# Patient Record
Sex: Male | Born: 1955 | Race: White | Hispanic: No | Smoking: Never smoker
Health system: Southern US, Community
[De-identification: ages and names within clinical notes are randomized; demographics above are authoritative.]

## PROBLEM LIST (undated history)

## (undated) DIAGNOSIS — I219 Acute myocardial infarction, unspecified: Secondary | ICD-10-CM

## (undated) DIAGNOSIS — F329 Major depressive disorder, single episode, unspecified: Secondary | ICD-10-CM

## (undated) DIAGNOSIS — I10 Essential (primary) hypertension: Secondary | ICD-10-CM

## (undated) DIAGNOSIS — I2510 Atherosclerotic heart disease of native coronary artery without angina pectoris: Secondary | ICD-10-CM

## (undated) DIAGNOSIS — E785 Hyperlipidemia, unspecified: Secondary | ICD-10-CM

## (undated) DIAGNOSIS — K8050 Calculus of bile duct without cholangitis or cholecystitis without obstruction: Secondary | ICD-10-CM

## (undated) DIAGNOSIS — E119 Type 2 diabetes mellitus without complications: Secondary | ICD-10-CM

## (undated) DIAGNOSIS — F32A Depression, unspecified: Secondary | ICD-10-CM

## (undated) DIAGNOSIS — J181 Lobar pneumonia, unspecified organism: Secondary | ICD-10-CM

## (undated) DIAGNOSIS — F419 Anxiety disorder, unspecified: Secondary | ICD-10-CM

## (undated) DIAGNOSIS — R911 Solitary pulmonary nodule: Secondary | ICD-10-CM

## (undated) HISTORY — DX: Anxiety disorder, unspecified: F41.9

## (undated) HISTORY — DX: Major depressive disorder, single episode, unspecified: F32.9

## (undated) HISTORY — DX: Hyperlipidemia, unspecified: E78.5

## (undated) HISTORY — DX: Type 2 diabetes mellitus without complications: E11.9

## (undated) HISTORY — DX: Atherosclerotic heart disease of native coronary artery without angina pectoris: I25.10

## (undated) HISTORY — DX: Calculus of bile duct without cholangitis or cholecystitis without obstruction: K80.50

## (undated) HISTORY — PX: CORONARY ARTERY BYPASS GRAFT: SHX141

## (undated) HISTORY — DX: Depression, unspecified: F32.A

## (undated) HISTORY — PX: EYE SURGERY: SHX253

## (undated) HISTORY — DX: Solitary pulmonary nodule: R91.1

## (undated) HISTORY — DX: Essential (primary) hypertension: I10

## (undated) HISTORY — DX: Lobar pneumonia, unspecified organism: J18.1

## (undated) HISTORY — DX: Acute myocardial infarction, unspecified: I21.9

---

## 2010-05-25 DIAGNOSIS — I219 Acute myocardial infarction, unspecified: Secondary | ICD-10-CM

## 2010-05-25 HISTORY — PX: CORONARY ARTERY BYPASS GRAFT: SHX141

## 2010-05-25 HISTORY — DX: Acute myocardial infarction, unspecified: I21.9

## 2015-06-19 ENCOUNTER — Ambulatory Visit (INDEPENDENT_AMBULATORY_CARE_PROVIDER_SITE_OTHER): Admitting: Family Medicine

## 2015-06-19 ENCOUNTER — Encounter: Payer: Self-pay | Admitting: Family Medicine

## 2015-06-19 VITALS — BP 157/91 | HR 88 | Ht 69.0 in | Wt 163.0 lb

## 2015-06-19 DIAGNOSIS — E1159 Type 2 diabetes mellitus with other circulatory complications: Secondary | ICD-10-CM | POA: Diagnosis not present

## 2015-06-19 DIAGNOSIS — I10 Essential (primary) hypertension: Secondary | ICD-10-CM

## 2015-06-19 DIAGNOSIS — M609 Myositis, unspecified: Secondary | ICD-10-CM

## 2015-06-19 DIAGNOSIS — Z951 Presence of aortocoronary bypass graft: Secondary | ICD-10-CM | POA: Diagnosis not present

## 2015-06-19 DIAGNOSIS — R112 Nausea with vomiting, unspecified: Secondary | ICD-10-CM

## 2015-06-19 DIAGNOSIS — IMO0001 Reserved for inherently not codable concepts without codable children: Secondary | ICD-10-CM

## 2015-06-19 DIAGNOSIS — E1165 Type 2 diabetes mellitus with hyperglycemia: Secondary | ICD-10-CM | POA: Insufficient documentation

## 2015-06-19 DIAGNOSIS — F329 Major depressive disorder, single episode, unspecified: Secondary | ICD-10-CM | POA: Insufficient documentation

## 2015-06-19 DIAGNOSIS — R1110 Vomiting, unspecified: Secondary | ICD-10-CM | POA: Insufficient documentation

## 2015-06-19 DIAGNOSIS — F419 Anxiety disorder, unspecified: Secondary | ICD-10-CM | POA: Insufficient documentation

## 2015-06-19 DIAGNOSIS — I252 Old myocardial infarction: Secondary | ICD-10-CM | POA: Diagnosis not present

## 2015-06-19 DIAGNOSIS — Z8639 Personal history of other endocrine, nutritional and metabolic disease: Secondary | ICD-10-CM

## 2015-06-19 DIAGNOSIS — I152 Hypertension secondary to endocrine disorders: Secondary | ICD-10-CM | POA: Insufficient documentation

## 2015-06-19 DIAGNOSIS — Z794 Long term (current) use of insulin: Secondary | ICD-10-CM

## 2015-06-19 DIAGNOSIS — M791 Myalgia: Secondary | ICD-10-CM

## 2015-06-19 DIAGNOSIS — F418 Other specified anxiety disorders: Secondary | ICD-10-CM

## 2015-06-19 LAB — CK: Total CK: 42 U/L (ref 7–232)

## 2015-06-19 LAB — CBC
HCT: 44.4 % (ref 39.0–52.0)
HEMOGLOBIN: 15.6 g/dL (ref 13.0–17.0)
MCH: 30.8 pg (ref 26.0–34.0)
MCHC: 35.1 g/dL (ref 30.0–36.0)
MCV: 87.6 fL (ref 78.0–100.0)
MPV: 9.5 fL (ref 8.6–12.4)
PLATELETS: 206 10*3/uL (ref 150–400)
RBC: 5.07 MIL/uL (ref 4.22–5.81)
RDW: 13 % (ref 11.5–15.5)
WBC: 6.5 10*3/uL (ref 4.0–10.5)

## 2015-06-19 LAB — COMPLETE METABOLIC PANEL WITH GFR
ALBUMIN: 4.3 g/dL (ref 3.6–5.1)
ALK PHOS: 64 U/L (ref 40–115)
ALT: 24 U/L (ref 9–46)
AST: 14 U/L (ref 10–35)
BUN: 12 mg/dL (ref 7–25)
CALCIUM: 9 mg/dL (ref 8.6–10.3)
CO2: 25 mmol/L (ref 20–31)
CREATININE: 0.91 mg/dL (ref 0.70–1.33)
Chloride: 102 mmol/L (ref 98–110)
GFR, Est African American: >89 mL/min (ref >=60)
GFR, Est Non African American: >89 mL/min (ref >=60)
Glucose, Bld: 137 mg/dL — ABNORMAL HIGH (ref 65–99)
POTASSIUM: 4.1 mmol/L (ref 3.5–5.3)
Sodium: 138 mmol/L (ref 135–146)
Total Bilirubin: 0.5 mg/dL (ref 0.2–1.2)
Total Protein: 6.9 g/dL (ref 6.1–8.1)

## 2015-06-19 LAB — HEMOGLOBIN A1C
HEMOGLOBIN A1C: 7 % — AB (ref <5.7)
MEAN PLASMA GLUCOSE: 154 mg/dL — AB (ref <117)

## 2015-06-19 LAB — LIPASE: Lipase: 19 U/L (ref 7–60)

## 2015-06-19 MED ORDER — SIMVASTATIN 40 MG PO TABS: 40.0000 mg | ORAL_TABLET | Freq: Every day | ORAL | Status: DC

## 2015-06-19 MED ORDER — ATENOLOL 25 MG PO TABS: 25.0000 mg | ORAL_TABLET | Freq: Every day | ORAL | Status: DC

## 2015-06-19 MED ORDER — METFORMIN HCL 1000 MG PO TABS: 1000.0000 mg | ORAL_TABLET | Freq: Two times a day (BID) | ORAL | Status: DC

## 2015-06-19 MED ORDER — NITROGLYCERIN 0.4 MG SL SUBL: 0.4000 mg | SUBLINGUAL_TABLET | SUBLINGUAL | Status: DC | PRN

## 2015-06-19 MED ORDER — FLUOXETINE HCL 20 MG PO TABS: 20.0000 mg | ORAL_TABLET | Freq: Every day | ORAL | Status: DC

## 2015-06-19 MED ORDER — CLOPIDOGREL BISULFATE 75 MG PO TABS: 75.0000 mg | ORAL_TABLET | Freq: Every day | ORAL | Status: DC

## 2015-06-19 NOTE — Progress Notes (Signed)
CC: Mark Maynard is a 60 y.o. male is here for Establish Care   Subjective: HPI:  Complicated but pleasant 60 year old here to establish care.  He has a history of type 2 diabetes and even had ketoacidosis last year. He's run out of his metformin 2 days ago. No outside blood sugars to report. Over the past 3 months he's been noticing forgetfulness, fatigue, a sweet odor to his feces, and vision loss ( blurry)   He has a history of at least 2 coronary bypass grafts and 7 stents. He's had multiple heart attacks. He takes aspirin and Plavix. He also takes a beta blocker and simvastatin. Since his last stent he denies any chest discomfort but does get anxious at climbing more than 2 flights of stairs. There've been no limb claudication.  He has a history of essential hypertension currently treated with atenolol. No outside blood pressure report.  He has a history of anxiety and depression that he feels is brought on by his complicated past medical history. His anxiety has been worsened over the past few weeks because he's being charged with not providing child support. He has a paper today from [facility] to clarify why he is disabled where he can't work. He doesn't his most recent job was at a [employer] however he was unable to work due to frequent vomiting and dehydration. He tells me he has to carry around a wastebasket because he could vomit at any time.  He does not daily basis for the past weeks he's had this diffuse body pain described as a weird sensation of his skin and muscles. Nothing seems to make it better it  gets worse if he tries to exercise.  Is coming by diffuse weakness without any focal weakness. He denies any overlying skin changes or any other motor or sensory disturbances.   Review Of Systems Outlined In HPI  Past Medical History  Diagnosis Date  . Diabetes mellitus without complication (HCC)   . Hypertension   . Hyperlipidemia   . Myocardial infarction (HCC)    . Depression   . Anxiety     History reviewed. No pertinent past surgical history. History reviewed. No pertinent family history.  Social History   Social History  . Marital Status: Unknown    Spouse Name: N/A  . Number of Children: N/A  . Years of Education: N/A   Occupational History  . Not on file.   Social History Main Topics  . Smoking status: Never Smoker   . Smokeless tobacco: Never Used  . Alcohol Use: 0.0 oz/week    0 Standard drinks or equivalent per week  . Drug Use: No  . Sexual Activity: Yes    Birth Control/ Protection: None   Other Topics Concern  . Not on file   Social History Narrative  . No narrative on file     Objective: BP 157/91 mmHg  Pulse 88  Ht  (1.753 m)  Wt 163 lb (73.936 kg)  BMI 24.06 kg/m2  General: Alert and Oriented, No Acute Distress HEENT: Pupils equal, round, reactive to light. Conjunctivae clear.  External ears unremarkable, canals clear with intact TMs with appropriate landmarks.  Middle ear appears open without effusion. Pink inferior turbinates.  Moist mucous membranes, pharynx without inflammation nor lesions.  Neck supple without palpable lymphadenopathy nor abnormal masses. Lungs: clear comfortable work of breathing Cardiac: Regular rate and rhythm. Extremities: No peripheral edema.  Strong peripheral pulses.  Mental Status: No depression, anxiety, nor  agitation. Skin: Warm and dry.  Assessment & Plan: Barnell was seen today for establish care.  Diagnoses and all orders for this visit:  Hx of CABG  Type 2 diabetes mellitus with other circulatory complication, with long-term current use of insulin (HCC) -     COMPLETE METABOLIC PANEL WITH GFR -     CBC -     Hemoglobin A1c -     Lipase  Essential hypertension -     COMPLETE METABOLIC PANEL WITH GFR -     CBC -     Hemoglobin A1c -     Lipase  History of heart attack  Anxiety and depression  History of ketoacidosis  Non-intractable vomiting with  nausea, vomiting of unspecified type -     COMPLETE METABOLIC PANEL WITH GFR -     CBC -     Hemoglobin A1c -     Lipase  Myalgia and myositis -     CK (Creatine Kinase)  Other orders -     nitroGLYCERIN (NITROSTAT) 0.4 MG SL tablet; Place 1 tablet (0.4 mg total) under the tongue every 5 (five) minutes as needed for chest pain. -     atenolol (TENORMIN) 25 MG tablet; Take 1 tablet (25 mg total) by mouth daily. -     clopidogrel (PLAVIX) 75 MG tablet; Take 1 tablet (75 mg total) by mouth daily. -     FLUoxetine (PROZAC) 20 MG tablet; Take 1 tablet (20 mg total) by mouth daily. -     simvastatin (ZOCOR) 40 MG tablet; Take 1 tablet (40 mg total) by mouth at bedtime. -     metFORMIN (GLUCOPHAGE) 1000 MG tablet; Take 1 tablet (1,000 mg total) by mouth 2 (two) times daily with a meal.   Type 2 diabetes: Number of his complaints that could be due to uncontrolled blood pressure therefore check an A1c and random blood sugar. Restart metformin, requesting outside records for exact dose of Lantus and NovoLog Essential hypertension: Controlled continue atenolol Coronary artery disease: Continue beta blocker statin aspirin and Plavix Anxiety and depression: Uncontrolled, restarting Prozac and encouraged him to seek out help from our psychiatric office downstairs. Vomiting: Checking a lipase, hepatic function panel and renal function. Myalgia: Checking CK.   Return if symptoms worsen or fail to improve.

## 2015-06-20 ENCOUNTER — Telehealth: Payer: Self-pay | Admitting: Family Medicine

## 2015-06-20 DIAGNOSIS — R112 Nausea with vomiting, unspecified: Secondary | ICD-10-CM

## 2015-06-20 NOTE — Telephone Encounter (Signed)
Pt advised.

## 2015-06-20 NOTE — Telephone Encounter (Signed)
Will you please let patient know that his A1c was 7.0 which is right at goal.  His liver function, kidney function, pancreatic function, and blood cell counts were all normal.  I'm at a loss to explain is extreme fatigue, I'd recommend he start an OTC Vitamin B Complex and a Vitamin D 1000 Units tablet daily to see if this helps.  Also I"m going to place a gastroenterology referral to help further workup his mysterious vomiting. I'll let him know once I receive records from his [place] doctor.

## 2016-03-19 ENCOUNTER — Other Ambulatory Visit: Payer: Self-pay | Admitting: *Deleted

## 2016-03-19 ENCOUNTER — Other Ambulatory Visit: Payer: Self-pay | Admitting: Osteopathic Medicine

## 2016-03-19 MED ORDER — METFORMIN HCL 1000 MG PO TABS: 1000.0000 mg | ORAL_TABLET | Freq: Two times a day (BID) | ORAL | 0 refills | Status: DC

## 2016-03-19 MED ORDER — CLOPIDOGREL BISULFATE 75 MG PO TABS: 75.0000 mg | ORAL_TABLET | Freq: Every day | ORAL | 0 refills | Status: DC

## 2016-03-19 MED ORDER — SIMVASTATIN 40 MG PO TABS: 40.0000 mg | ORAL_TABLET | Freq: Every day | ORAL | 0 refills | Status: DC

## 2016-03-19 MED ORDER — ATENOLOL 25 MG PO TABS: 25.0000 mg | ORAL_TABLET | Freq: Every day | ORAL | 0 refills | Status: DC

## 2016-03-19 MED ORDER — FLUOXETINE HCL 20 MG PO TABS: 20.0000 mg | ORAL_TABLET | Freq: Every day | ORAL | 0 refills | Status: DC

## 2016-03-19 NOTE — Progress Notes (Signed)
Patient needs 30 min appt to reestablish for DM and BP/health maint. and really another appt for depression.

## 2016-03-19 NOTE — Telephone Encounter (Signed)
Pt called clinic today requesting refills on his medications. States he was just informed of Dr. Genelle BalHommel's departure and is looking for a new PCP. Pt is requesting a refill on the pended medications for a 30 day supply. Questioned how long Pt has been out of Rx's since we have not written anything since January and he states he has been "milking them to last." Pt is going to find a new PCP at a different office. Will route to Provider in office today for review on 30 day supply.

## 2016-03-25 ENCOUNTER — Telehealth: Payer: Self-pay | Admitting: Family Medicine

## 2016-03-25 NOTE — Telephone Encounter (Signed)
I called patient and talk with him and let him know that he does need to make an appointment with a new pcp here since Dr. Ivan AnchorsHommel is no longer here and this way he can f/u on meds, DM and BP. He states he will call to schedule

## 2016-04-02 ENCOUNTER — Other Ambulatory Visit: Payer: Self-pay

## 2016-04-02 MED ORDER — ATENOLOL 25 MG PO TABS: 25.0000 mg | ORAL_TABLET | Freq: Every day | ORAL | 0 refills | Status: DC

## 2016-04-02 MED ORDER — METFORMIN HCL 1000 MG PO TABS: 1000.0000 mg | ORAL_TABLET | Freq: Two times a day (BID) | ORAL | 0 refills | Status: DC

## 2016-04-28 ENCOUNTER — Ambulatory Visit (INDEPENDENT_AMBULATORY_CARE_PROVIDER_SITE_OTHER): Admitting: Family Medicine

## 2016-04-28 ENCOUNTER — Encounter: Payer: Self-pay | Admitting: Family Medicine

## 2016-04-28 VITALS — BP 155/82 | HR 78 | Ht 69.0 in | Wt 161.0 lb

## 2016-04-28 DIAGNOSIS — Z23 Encounter for immunization: Secondary | ICD-10-CM

## 2016-04-28 DIAGNOSIS — F329 Major depressive disorder, single episode, unspecified: Secondary | ICD-10-CM

## 2016-04-28 DIAGNOSIS — Z1159 Encounter for screening for other viral diseases: Secondary | ICD-10-CM

## 2016-04-28 DIAGNOSIS — I2510 Atherosclerotic heart disease of native coronary artery without angina pectoris: Secondary | ICD-10-CM | POA: Insufficient documentation

## 2016-04-28 DIAGNOSIS — Z794 Long term (current) use of insulin: Secondary | ICD-10-CM | POA: Diagnosis not present

## 2016-04-28 DIAGNOSIS — I10 Essential (primary) hypertension: Secondary | ICD-10-CM

## 2016-04-28 DIAGNOSIS — F418 Other specified anxiety disorders: Secondary | ICD-10-CM

## 2016-04-28 DIAGNOSIS — Z951 Presence of aortocoronary bypass graft: Secondary | ICD-10-CM | POA: Diagnosis not present

## 2016-04-28 DIAGNOSIS — I25118 Atherosclerotic heart disease of native coronary artery with other forms of angina pectoris: Secondary | ICD-10-CM

## 2016-04-28 DIAGNOSIS — E1159 Type 2 diabetes mellitus with other circulatory complications: Secondary | ICD-10-CM | POA: Diagnosis not present

## 2016-04-28 DIAGNOSIS — Z114 Encounter for screening for human immunodeficiency virus [HIV]: Secondary | ICD-10-CM

## 2016-04-28 DIAGNOSIS — F419 Anxiety disorder, unspecified: Secondary | ICD-10-CM

## 2016-04-28 LAB — POCT GLYCOSYLATED HEMOGLOBIN (HGB A1C): Hemoglobin A1C: 6.6

## 2016-04-28 LAB — POCT UA - MICROALBUMIN
Albumin/Creatinine Ratio, Urine, POC: <30
Creatinine, POC: 50 mg/dL
MICROALBUMIN (UR) POC: 10 mg/L

## 2016-04-28 MED ORDER — SIMVASTATIN 40 MG PO TABS: 40.0000 mg | ORAL_TABLET | Freq: Every day | ORAL | 0 refills | Status: DC

## 2016-04-28 MED ORDER — INSULIN GLARGINE 100 UNIT/ML SOLOSTAR PEN
25.0000 [IU] | PEN_INJECTOR | Freq: Every day | SUBCUTANEOUS | 1 refills | Status: DC
Start: 2016-04-28 — End: 2016-10-23

## 2016-04-28 MED ORDER — INSULIN ASPART 100 UNIT/ML ~~LOC~~ SOLN: 3.0000 [IU] | Freq: Three times a day (TID) | SUBCUTANEOUS | 1 refills | Status: DC

## 2016-04-28 MED ORDER — ATENOLOL 25 MG PO TABS: 25.0000 mg | ORAL_TABLET | Freq: Every day | ORAL | 0 refills | Status: DC

## 2016-04-28 MED ORDER — METFORMIN HCL 1000 MG PO TABS: 1000.0000 mg | ORAL_TABLET | Freq: Two times a day (BID) | ORAL | 0 refills | Status: DC

## 2016-04-28 MED ORDER — CLOPIDOGREL BISULFATE 75 MG PO TABS: 75.0000 mg | ORAL_TABLET | Freq: Every day | ORAL | 0 refills | Status: DC

## 2016-04-28 MED ORDER — FLUOXETINE HCL 40 MG PO CAPS: 40.0000 mg | ORAL_CAPSULE | Freq: Every day | ORAL | 0 refills | Status: DC

## 2016-04-28 NOTE — Progress Notes (Signed)
Subjective:     Patient ID: Mark FusiJoseph Brinkmeyer, male   DOB: November 01, 1955, 60 y.o.   MRN: [id number]  HPI This is a pleasant 60 yo M with PMHx significant for DMII, multiple myocardial infarctions s/p CABG, HTN, and depression. He is new to this practice and would like to establish care and refill his medications.  Diabetes Type II: Has had DM since 2002. States he was uncontrolled for some time with A1Cs running over 9.0. Takes Novolog 3 units every meal and Lantus 25 units before bed. Does not check his blood sugars. No recent hypoglycemic episodes. Has not had an eye exam in years.  Lab Results  Component Value Date   HGBA1C 6.6 04/28/2016    CAD: [facility]. He carries Nitro on his person for angina. Takes Clopidogrel daily.   HTN: has been on Atenolol since 2002. Ran out of this medication this week. Denies headache or chest pain.  Depression: has been stable on Prozac since 2002. Says he has been told before that he has Bipolar I. One month ago he ran out of his medication and states he was able to go days without sleeping and impulsively drove to [place]. No history of hospitalizations. PHQ-9 score 1 today  Preventive Care: -Colonoscopy: has never had colon cancer screening. States he does not feel he has symptoms and he has no family history.  -Ophthalmology exam: overdue. Plans to establish care with an eye doctor. -Pneumonia vaccine: has never had. Patient would like to receive it today.   Review of Systems  Constitutional: Negative.   Respiratory: Negative for cough and shortness of breath.   Cardiovascular: Negative for chest pain, palpitations and leg swelling.  Gastrointestinal: Negative.   Endocrine: Negative.   Genitourinary: Negative.  Negative for difficulty urinating.  Musculoskeletal: Negative.   Skin: Negative.   Neurological: Negative.   Psychiatric/Behavioral: Negative.        Objective:   Physical Exam  Constitutional: He is active.  He does not appear ill. No distress.  HENT:  Mouth/Throat: Uvula is midline, oropharynx is clear and moist and mucous membranes are normal. No oral lesions. Dental caries present.  Eyes: Conjunctivae and EOM are normal. Pupils are equal, round, and reactive to light.  Cardiovascular: Normal rate, regular rhythm, normal heart sounds and normal pulses.   Pulmonary/Chest: Effort normal and breath sounds normal.  Abdominal: Soft. There is no tenderness.  Neurological: He is alert. No cranial nerve deficit. Gait normal.  Skin: Skin is warm. No rash noted.  Psychiatric: He has a normal mood and affect. His speech is normal and behavior is normal.       Vitals:   04/28/16 1054  BP: (!) 155/82  Pulse: 78      Assessment & Plan:     1. Essential hypertension - atenolol (TENORMIN) 25 MG tablet; Take 1 tablet (25 mg total) by mouth daily. Please schedule a f/u appt  Dispense: 90 tablet; Refill: 0 Noted that his blood pressure was elevated today. He has not taken his Atenolol in 2 days. Will monitor and reassess in 3 months.  2. Type 2 diabetes mellitus with other circulatory complication, with long-term current use of insulin (HCC) A1C is 6.6 on current medication regimen - metFORMIN (GLUCOPHAGE) 1000 MG tablet; Take 1 tablet (1,000 mg total) by mouth 2 (two) times daily with a meal.  Dispense: 90 tablet; Refill: 0 - Insulin Glargine (LANTUS SOLOSTAR) 100 UNIT/ML Solostar Pen; Inject 25 Units  into the skin at bedtime.  Dispense: 5 pen; Refill: 1 - Comprehensive metabolic panel - Lipid panel - CBC - POCT HgB A1C - POCT UA - Microalbumin  3. Hx of CABG - clopidogrel (PLAVIX) 75 MG tablet; Take 1 tablet (75 mg total) by mouth daily.  Dispense: 90 tablet; Refill: 0 - simvastatin (ZOCOR) 40 MG tablet; Take 1 tablet (40 mg total) by mouth at bedtime.  Dispense: 90 tablet; Refill: 0 - Lipid panel  4. Anxiety and depression - FLUoxetine (PROZAC) 40 MG capsule; Take 1 capsule (40 mg total) by  mouth daily.  Dispense: 90 capsule; Refill: 0  5. Coronary artery disease of native heart with stable angina pectoris, unspecified vessel or lesion type (HCC) - clopidogrel (PLAVIX) 75 MG tablet; Take 1 tablet (75 mg total) by mouth daily.  Dispense: 90 tablet; Refill: 0 - simvastatin (ZOCOR) 40 MG tablet; Take 1 tablet (40 mg total) by mouth at bedtime.  Dispense: 90 tablet; Refill: 0 - Lipid panel  6. Screening for HIV (human immunodeficiency virus) - HIV antibody  7. Encounter for hepatitis C screening test for low risk patient - Hepatitis C antibody  8. Need for vaccination - Pneumococcal polysaccharide vaccine 23-valent greater than or equal to 2yo subcutaneous/IM    Patient education provided Follow-up in 3 months Will re-address colonoscopy and other preventive care at that time  I spent 40 minutes with this patient, greater than 50% was face-to-face time counseling regarding the above diagnosis.   Medical screening examination/treatment/procedure(s) were performed by a resident physician or non-physician practitioner and as the supervising physician I was immediately available for consultation/collaboration.  Clementeen GrahamEvan Leondre Taul, MD

## 2016-04-29 LAB — LIPID PANEL
CHOL/HDL RATIO: 3.9 ratio (ref <5.0)
CHOLESTEROL: 181 mg/dL (ref <200)
HDL: 47 mg/dL (ref >40)
LDL Cholesterol: 96 mg/dL (ref <100)
Triglycerides: 189 mg/dL — ABNORMAL HIGH (ref <150)
VLDL: 38 mg/dL — ABNORMAL HIGH (ref <30)

## 2016-04-29 LAB — COMPREHENSIVE METABOLIC PANEL
ALBUMIN: 4.4 g/dL (ref 3.6–5.1)
ALK PHOS: 65 U/L (ref 40–115)
ALT: 22 U/L (ref 9–46)
AST: 12 U/L (ref 10–35)
BUN: 11 mg/dL (ref 7–25)
CO2: 24 mmol/L (ref 20–31)
CREATININE: 0.87 mg/dL (ref 0.70–1.25)
Calcium: 9.1 mg/dL (ref 8.6–10.3)
Chloride: 103 mmol/L (ref 98–110)
Glucose, Bld: 171 mg/dL — ABNORMAL HIGH (ref 65–99)
POTASSIUM: 4.3 mmol/L (ref 3.5–5.3)
Sodium: 138 mmol/L (ref 135–146)
TOTAL PROTEIN: 6.7 g/dL (ref 6.1–8.1)
Total Bilirubin: 0.6 mg/dL (ref 0.2–1.2)

## 2016-04-29 LAB — CBC
HEMATOCRIT: 42.2 % (ref 38.5–50.0)
Hemoglobin: 14.8 g/dL (ref 13.2–17.1)
MCH: 31.4 pg (ref 27.0–33.0)
MCHC: 35.1 g/dL (ref 32.0–36.0)
MCV: 89.6 fL (ref 80.0–100.0)
MPV: 9.8 fL (ref 7.5–12.5)
Platelets: 207 10*3/uL (ref 140–400)
RBC: 4.71 MIL/uL (ref 4.20–5.80)
RDW: 13 % (ref 11.0–15.0)
WBC: 9.7 10*3/uL (ref 3.8–10.8)

## 2016-04-30 LAB — HIV ANTIBODY (ROUTINE TESTING W REFLEX): HIV 1&2 Ab, 4th Generation: NONREACTIVE

## 2016-04-30 LAB — HEPATITIS C ANTIBODY: HCV Ab: NEGATIVE

## 2016-05-04 ENCOUNTER — Emergency Department (HOSPITAL_BASED_OUTPATIENT_CLINIC_OR_DEPARTMENT_OTHER)
Admission: EM | Admit: 2016-05-04 | Discharge: 2016-05-04 | Disposition: A | Discharge status: Home / Self Care | Attending: Emergency Medicine | Admitting: Emergency Medicine

## 2016-05-04 ENCOUNTER — Encounter (HOSPITAL_BASED_OUTPATIENT_CLINIC_OR_DEPARTMENT_OTHER): Payer: Self-pay | Admitting: *Deleted

## 2016-05-04 ENCOUNTER — Emergency Department (HOSPITAL_BASED_OUTPATIENT_CLINIC_OR_DEPARTMENT_OTHER)

## 2016-05-04 DIAGNOSIS — E119 Type 2 diabetes mellitus without complications: Secondary | ICD-10-CM | POA: Diagnosis not present

## 2016-05-04 DIAGNOSIS — K625 Hemorrhage of anus and rectum: Secondary | ICD-10-CM | POA: Diagnosis not present

## 2016-05-04 DIAGNOSIS — Z794 Long term (current) use of insulin: Secondary | ICD-10-CM | POA: Diagnosis not present

## 2016-05-04 DIAGNOSIS — I252 Old myocardial infarction: Secondary | ICD-10-CM | POA: Diagnosis not present

## 2016-05-04 DIAGNOSIS — I10 Essential (primary) hypertension: Secondary | ICD-10-CM | POA: Insufficient documentation

## 2016-05-04 DIAGNOSIS — Z79899 Other long term (current) drug therapy: Secondary | ICD-10-CM | POA: Diagnosis not present

## 2016-05-04 DIAGNOSIS — I2510 Atherosclerotic heart disease of native coronary artery without angina pectoris: Secondary | ICD-10-CM | POA: Diagnosis not present

## 2016-05-04 DIAGNOSIS — R1030 Lower abdominal pain, unspecified: Secondary | ICD-10-CM | POA: Diagnosis present

## 2016-05-04 LAB — CBC
HCT: 37.7 % — ABNORMAL LOW (ref 39.0–52.0)
Hemoglobin: 13.4 g/dL (ref 13.0–17.0)
MCH: 31.6 pg (ref 26.0–34.0)
MCHC: 35.5 g/dL (ref 30.0–36.0)
MCV: 88.9 fL (ref 78.0–100.0)
Platelets: 201 10*3/uL (ref 150–400)
RBC: 4.24 MIL/uL (ref 4.22–5.81)
RDW: 11.9 % (ref 11.5–15.5)
WBC: 6.9 10*3/uL (ref 4.0–10.5)

## 2016-05-04 LAB — COMPREHENSIVE METABOLIC PANEL
ALK PHOS: 59 U/L (ref 38–126)
ALT: 24 U/L (ref 17–63)
AST: 19 U/L (ref 15–41)
Albumin: 4.2 g/dL (ref 3.5–5.0)
Anion gap: 8 (ref 5–15)
BUN: 17 mg/dL (ref 6–20)
CALCIUM: 9.3 mg/dL (ref 8.9–10.3)
CHLORIDE: 103 mmol/L (ref 101–111)
CO2: 25 mmol/L (ref 22–32)
CREATININE: 0.85 mg/dL (ref 0.61–1.24)
GFR calc Af Amer: >60 mL/min (ref >60)
GFR calc non Af Amer: >60 mL/min (ref >60)
GLUCOSE: 149 mg/dL — AB (ref 65–99)
Potassium: 3.8 mmol/L (ref 3.5–5.1)
SODIUM: 136 mmol/L (ref 135–145)
Total Bilirubin: 0.4 mg/dL (ref 0.3–1.2)
Total Protein: 6.8 g/dL (ref 6.5–8.1)

## 2016-05-04 LAB — OCCULT BLOOD X 1 CARD TO LAB, STOOL: Fecal Occult Bld: POSITIVE — AB

## 2016-05-04 MED ORDER — POLYETHYLENE GLYCOL 3350 17 GM/SCOOP PO POWD: 17.0000 g | Freq: Two times a day (BID) | ORAL | 0 refills | Status: DC

## 2016-05-04 MED ORDER — IOPAMIDOL (ISOVUE-300) INJECTION 61%
100.0000 mL | Freq: Once | INTRAVENOUS | Status: AC | PRN
  Administered 2016-05-04: 100 mL via INTRAVENOUS

## 2016-05-04 MED ORDER — ONDANSETRON HCL 4 MG/2ML IJ SOLN
4.0000 mg | Freq: Once | INTRAMUSCULAR | Status: AC
  Administered 2016-05-04: 4 mg via INTRAVENOUS
  Filled 2016-05-04: qty 2

## 2016-05-04 MED ORDER — MORPHINE SULFATE (PF) 4 MG/ML IV SOLN
4.0000 mg | Freq: Once | INTRAVENOUS | Status: DC
  Filled 2016-05-04: qty 1

## 2016-05-04 NOTE — ED Notes (Signed)
Pt drove self to hospital and rates pain 2/10. Will hold morphine at this time. Pt instructed to let nurse know if pain worsened and he could have the morphine. Pt verbalized understanding.

## 2016-05-04 NOTE — Discharge Instructions (Signed)
Your labs and scan were largely unremarkable tonight.  You blood counts were normal.  There was no evidence of infection in your abdomen.  The scan did have an incidental finding of gallstones in your gallbladder, this does not require follow-up unless you get pain in your right upper abdomen.   Please schedule an appointment with the gastroenterologist.  You need to have a colonoscopy.  Return if you feel faint, short of breath, or have chest pain.

## 2016-05-04 NOTE — ED Provider Notes (Signed)
[facility] Provider Note   CSN: [id number] Arrival date & time: 05/04/16  2956 By signing my name below, I, Mark Maynard, attest that this documentation has been prepared under the direction and in the presence of [name]. Electronically Signed: Linus Maynard, ED Scribe. 05/04/16. 8:44 PM.  History   Chief Complaint Chief Complaint  Patient presents with  . Rectal Bleeding   The history is provided by the patient. No language interpreter was used.   HPI Comments: Mark Maynard is a 60 y.o. male who presents to the Emergency Department with a PMHx of DM, HLD, HTN, and MI complaining of rectal bleeding with associated pain that began today. Pt also reports constant lower abdominal pain, 2/10 in severity. Pt states that when he woke up this morning, he noted moisture around his rectum. When he checked, he noted "rust" colored blood. This happened again later in the day. Pt denies any dysuria, hematuria, fevers, chills, CP, SOB, N/V/D or any other symptoms at this time. Pt last BM was today. Pt denies any new medications or foods. Pt is on Plavix and ASA. Pt has  never had a colonoscopy.   Past Medical History:  Diagnosis Date  . Anxiety   . Depression   . Diabetes mellitus without complication (HCC)   . Hyperlipidemia   . Hypertension   . Myocardial infarction 2012   x 3, s/p CABG, 9 stents    Patient Active Problem List   Diagnosis Date Noted  . Coronary artery disease 04/28/2016  . Hx of CABG 06/19/2015  . Type 2 diabetes mellitus (HCC) 06/19/2015  . Essential hypertension 06/19/2015  . History of heart attack 06/19/2015  . Anxiety and depression 06/19/2015  . History of ketoacidosis 06/19/2015  . Emesis 06/19/2015    Past Surgical History:  Procedure Laterality Date  . CORONARY ARTERY BYPASS GRAFT  2012  . EYE SURGERY  1980's   ptyrgium removal       Home Medications    Prior to Admission medications   Medication Sig Start Date End Date  Taking? Authorizing Provider  atenolol (TENORMIN) 25 MG tablet Take 1 tablet (25 mg total) by mouth daily. Please schedule a f/u appt 04/28/16   Rodolph Bong, MD  clopidogrel (PLAVIX) 75 MG tablet Take 1 tablet (75 mg total) by mouth daily. 04/28/16   Rodolph Bong, MD  FLUoxetine (PROZAC) 40 MG capsule Take 1 capsule (40 mg total) by mouth daily. 04/28/16   Rodolph Bong, MD  insulin aspart (NOVOLOG) 100 UNIT/ML injection Inject 3 Units into the skin 3 (three) times daily with meals. 04/28/16   Rodolph Bong, MD  Insulin Glargine (LANTUS SOLOSTAR) 100 UNIT/ML Solostar Pen Inject 25 Units into the skin at bedtime. 04/28/16   Rodolph Bong, MD  insulin glargine (LANTUS) 100 UNIT/ML injection Inject into the skin.    Historical Provider, MD  metFORMIN (GLUCOPHAGE) 1000 MG tablet Take 1 tablet (1,000 mg total) by mouth 2 (two) times daily with a meal. 04/28/16   Rodolph Bong, MD  nitroGLYCERIN (NITROSTAT) 0.4 MG SL tablet Place 1 tablet (0.4 mg total) under the tongue every 5 (five) minutes as needed for chest pain. 06/19/15   Sean Hommel, DO  simvastatin (ZOCOR) 40 MG tablet Take 1 tablet (40 mg total) by mouth at bedtime. 04/28/16   Rodolph Bong, MD    Family History Family History  Problem Relation Age of Onset  . Melanoma Father     Social  History Social History  Substance Use Topics  . Smoking status: Never Smoker  . Smokeless tobacco: Never Used  . Alcohol use 0.6 oz/week    1 Cans of beer per week     Allergies   Patient has no known allergies.   Review of Systems Review of Systems  Constitutional: Negative for chills and fever.  Respiratory: Negative for shortness of breath.   Cardiovascular: Negative for chest pain.  Gastrointestinal: Positive for abdominal pain and rectal pain. Negative for diarrhea, nausea and vomiting.  Genitourinary: Negative for dysuria and hematuria.   Physical Exam Updated Vital Signs BP 148/78   Pulse 67   Temp 97.9 F (36.6 C)   Resp 18   Ht 5\' 8"   (1.727 m)   Wt 160 lb (72.6 kg)   SpO2 98%   BMI 24.33 kg/m   Physical Exam  Constitutional: He is oriented to person, place, and time. He appears well-developed and well-nourished. No distress.  HENT:  Head: Normocephalic and atraumatic.  Nose: Nose normal.  Eyes: Conjunctivae are normal.  Neck: Neck supple. No tracheal deviation present.  Cardiovascular: Normal rate and regular rhythm.   Pulmonary/Chest: Effort normal. No respiratory distress.  Abdominal: Soft. Bowel sounds are normal. He exhibits no distension and no mass. There is tenderness. There is no rebound and no guarding. No hernia.  Mild LLQ tenderness  Genitourinary:  Genitourinary Comments: Chaperone present. Rectal exam remarkable for scant gross blood. No evidence of hemorrhoid, abscess, or fissure.   Neurological: He is alert and oriented to person, place, and time.  Skin: Skin is warm and dry.  Psychiatric: He has a normal mood and affect.   ED Treatments / Results  DIAGNOSTIC STUDIES: Oxygen Saturation is 98% on room air, normal by my interpretation.    COORDINATION OF CARE: 8:44 PM Discussed treatment plan with pt at bedside and pt agreed to plan.  Labs (all labs ordered are listed, but only abnormal results are displayed) Labs Reviewed  CBC - Abnormal; Notable for the following:       Result Value   HCT 37.7 (*)    All other components within normal limits  COMPREHENSIVE METABOLIC PANEL  OCCULT BLOOD X 1 CARD TO LAB, STOOL    EKG  EKG Interpretation None       Radiology No results found.  Procedures Procedures (including critical care time)  Medications Ordered in ED Medications - No data to display   Initial Impression / Assessment and Plan / ED Course  I have reviewed the triage vital signs and the nursing notes.  Pertinent labs & imaging results that were available during my care of the patient were reviewed by me and considered in my medical decision making (see chart for  details).  Clinical Course     Patient presents with rectal bleeding 1 day. There are no visible or palpable external or internal hemorrhoids. H&H is stable. Patient has had some left lower quadrant pain, but has no acute CT findings. There is an incidental cholelithiasis, but he has no right upper quadrant pain. He has never had a colonoscopy. I have discussed the importance of getting a colonoscopy and follow-up. Return precautions discussed. He is well-appearing, nontoxic, and stable for discharge and outpatient follow-up.  Patient discussed with Dr. Ethelda ChickJacubowitz, who agrees with the plan.  Final Clinical Impressions(s) / ED Diagnoses   Final diagnoses:  Rectal bleeding    New Prescriptions Discharge Medication List as of 05/04/2016 11:48 PM    START  taking these medications   Details  polyethylene glycol powder (GLYCOLAX/MIRALAX) powder Take 17 g by mouth 2 (two) times daily. Until daily soft stools  OTC, Starting Mon 05/04/2016, Print      I personally performed the services described in this documentation, which was scribed in my presence. The recorded information has been reviewed and is accurate.      Roxy Horsemanobert Leven Hoel, PA-C 05/05/16 0001    Doug SouSam Jacubowitz, MD 05/05/16 [id number]

## 2016-05-04 NOTE — ED Notes (Signed)
ED Provider at bedside. 

## 2016-05-04 NOTE — ED Triage Notes (Addendum)
Pt c/o rectal bleeding, lower adb pain, and rectal pain  with BM today

## 2016-07-27 ENCOUNTER — Ambulatory Visit: Admitting: Family Medicine

## 2016-08-18 ENCOUNTER — Other Ambulatory Visit: Payer: Self-pay

## 2016-08-18 DIAGNOSIS — F329 Major depressive disorder, single episode, unspecified: Secondary | ICD-10-CM

## 2016-08-18 DIAGNOSIS — F419 Anxiety disorder, unspecified: Principal | ICD-10-CM

## 2016-08-18 DIAGNOSIS — Z794 Long term (current) use of insulin: Secondary | ICD-10-CM

## 2016-08-18 DIAGNOSIS — Z951 Presence of aortocoronary bypass graft: Secondary | ICD-10-CM

## 2016-08-18 DIAGNOSIS — I10 Essential (primary) hypertension: Secondary | ICD-10-CM

## 2016-08-18 DIAGNOSIS — E1159 Type 2 diabetes mellitus with other circulatory complications: Secondary | ICD-10-CM

## 2016-08-18 MED ORDER — ATENOLOL 25 MG PO TABS: 25.0000 mg | ORAL_TABLET | Freq: Every day | ORAL | 0 refills | Status: DC

## 2016-08-18 MED ORDER — FLUOXETINE HCL 40 MG PO CAPS: ORAL_CAPSULE | ORAL | 0 refills | Status: DC

## 2016-08-18 MED ORDER — SIMVASTATIN 40 MG PO TABS: 40.0000 mg | ORAL_TABLET | Freq: Every day | ORAL | 0 refills | Status: DC

## 2016-08-18 MED ORDER — METFORMIN HCL 1000 MG PO TABS: 1000.0000 mg | ORAL_TABLET | Freq: Two times a day (BID) | ORAL | 0 refills | Status: DC

## 2016-09-25 ENCOUNTER — Other Ambulatory Visit: Payer: Self-pay | Admitting: Osteopathic Medicine

## 2016-10-01 ENCOUNTER — Other Ambulatory Visit: Payer: Self-pay | Admitting: *Deleted

## 2016-10-01 DIAGNOSIS — Z951 Presence of aortocoronary bypass graft: Secondary | ICD-10-CM

## 2016-10-01 MED ORDER — CLOPIDOGREL BISULFATE 75 MG PO TABS: 75.0000 mg | ORAL_TABLET | Freq: Every day | ORAL | 0 refills | Status: DC

## 2016-10-23 ENCOUNTER — Other Ambulatory Visit: Payer: Self-pay

## 2016-10-23 ENCOUNTER — Ambulatory Visit (INDEPENDENT_AMBULATORY_CARE_PROVIDER_SITE_OTHER): Admitting: Physician Assistant

## 2016-10-23 VITALS — BP 131/80 | HR 66 | Wt 153.0 lb

## 2016-10-23 DIAGNOSIS — I2510 Atherosclerotic heart disease of native coronary artery without angina pectoris: Secondary | ICD-10-CM | POA: Diagnosis not present

## 2016-10-23 DIAGNOSIS — Z7689 Persons encountering health services in other specified circumstances: Secondary | ICD-10-CM | POA: Diagnosis not present

## 2016-10-23 DIAGNOSIS — E11628 Type 2 diabetes mellitus with other skin complications: Secondary | ICD-10-CM | POA: Diagnosis not present

## 2016-10-23 DIAGNOSIS — E785 Hyperlipidemia, unspecified: Secondary | ICD-10-CM

## 2016-10-23 DIAGNOSIS — L03032 Cellulitis of left toe: Secondary | ICD-10-CM | POA: Diagnosis not present

## 2016-10-23 DIAGNOSIS — B351 Tinea unguium: Secondary | ICD-10-CM

## 2016-10-23 DIAGNOSIS — Z79899 Other long term (current) drug therapy: Secondary | ICD-10-CM

## 2016-10-23 DIAGNOSIS — Z1211 Encounter for screening for malignant neoplasm of colon: Secondary | ICD-10-CM

## 2016-10-23 DIAGNOSIS — F419 Anxiety disorder, unspecified: Secondary | ICD-10-CM

## 2016-10-23 DIAGNOSIS — F329 Major depressive disorder, single episode, unspecified: Secondary | ICD-10-CM

## 2016-10-23 LAB — POCT GLYCOSYLATED HEMOGLOBIN (HGB A1C): Hemoglobin A1C: 9

## 2016-10-23 MED ORDER — AMBULATORY NON FORMULARY MEDICATION: 0 refills | Status: DC

## 2016-10-23 MED ORDER — FLUOXETINE HCL 40 MG PO CAPS: 40.0000 mg | ORAL_CAPSULE | Freq: Every day | ORAL | 1 refills | Status: DC

## 2016-10-23 MED ORDER — ATENOLOL 25 MG PO TABS: 25.0000 mg | ORAL_TABLET | Freq: Every day | ORAL | 1 refills | Status: DC

## 2016-10-23 MED ORDER — CLINDAMYCIN HCL 300 MG PO CAPS: 300.0000 mg | ORAL_CAPSULE | Freq: Three times a day (TID) | ORAL | 0 refills | Status: AC

## 2016-10-23 MED ORDER — ATORVASTATIN CALCIUM 40 MG PO TABS: 40.0000 mg | ORAL_TABLET | Freq: Every day | ORAL | 1 refills | Status: DC

## 2016-10-23 MED ORDER — CIPROFLOXACIN HCL 750 MG PO TABS: 750.0000 mg | ORAL_TABLET | Freq: Two times a day (BID) | ORAL | 0 refills | Status: AC

## 2016-10-23 MED ORDER — DAPAGLIFLOZIN PRO-METFORMIN ER 5-1000 MG PO TB24: 10.0000 mg | ORAL_TABLET | Freq: Every day | ORAL | 1 refills | Status: DC

## 2016-10-23 NOTE — Progress Notes (Signed)
HPI:                                                                Mark Maynard is a 61 y.o. male who [name] today to establish care   DMII: taking Metformin once daily. 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 Not compliant with medications. Last A1C 6.6 (04/28/16). Does not checks blood sugars at home. Denies polyuria, vision change, and paresthesias. Denies hypoglycemic events. Patient reports he removed an in-grown toe-nail on his left great toe a few days ago and it has been painful and red. Eye exam: overdue  HTN: taking Atentolol daily. Compliant with medications. Does not check BP's at home. Denies vision change, headache, chest pain with exertion, orthopnea, lightheadedness, syncope and edema.   CAD/HLD: patient has a history of CABG x 2 and 7 stents. 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 His most recent event was 07/12/15. He has not established with a cardiologist. He is currently out of his Plavix. Taking Simvastatin 40mg  daily. Denies myalgias. Last LDL 96 (04/28/16). He denies chest pain with exertion and claudication. He states he does not have anginal episodes and has not needed nitro.  Health Maintenance Health Maintenance  Topic Date Due  . OPHTHALMOLOGY EXAM  04/24/2017 (Originally 11/14/1965)  . Fecal DNA (Cologuard)  04/24/2017 (Originally 11/14/2005)  . TETANUS/TDAP  10/23/2017 (Originally 11/15/1974)  . INFLUENZA VACCINE  12/23/2016  . HEMOGLOBIN A1C  04/24/2017  . FOOT EXAM  10/23/2017  . PNEUMOCOCCAL POLYSACCHARIDE VACCINE (2) 04/28/2021  . Hepatitis C Screening  Completed  . HIV Screening  Completed  . URINE MICROALBUMIN  Excluded    Past Medical History:  Diagnosis Date  . Anxiety   . Depression   . Diabetes mellitus without complication (HCC)   . Hyperlipidemia   . Hypertension   . Myocardial infarction 2012   x 3, s/p CABG, 9 stents   Past Surgical History:  Procedure  Laterality Date  . CORONARY ARTERY BYPASS GRAFT  2012  . EYE SURGERY  1980's   ptyrgium removal   Social History  Substance Use Topics  . Smoking status: Never Smoker  . Smokeless tobacco: Never Used  . Alcohol use 0.6 oz/week    1 Cans of beer per week   family history includes Melanoma in his father.  ROS: negative except as noted in the HPI  Medications: Current Outpatient Prescriptions  Medication Sig Dispense Refill  . atenolol (TENORMIN) 25 MG tablet Take 1 tablet (25 mg total) by mouth daily. 90 tablet 1  . clopidogrel (PLAVIX) 75 MG tablet Take 1 tablet (75 mg total) by mouth daily. 30 tablet 0  . FLUoxetine (PROZAC) 40 MG capsule Take 1 capsule (40 mg total) by mouth daily. 90 capsule 1  . AMBULATORY NON FORMULARY MEDICATION Single glucometer with lancets, test strips.  Please bill with insurance. 1 each 0  . atorvastatin (LIPITOR) 40 MG tablet Take 1 tablet (40 mg total) by mouth daily. 90 tablet 1  . ciprofloxacin (CIPRO) 750 MG tablet Take 1 tablet (750 mg total) by mouth 2 (two) times daily. 20 tablet 0  . clindamycin (CLEOCIN) 300 MG capsule Take 1 capsule (300 mg total) by mouth 3 (three) times  daily. 30 capsule 0  . Dapagliflozin-Metformin HCl ER (XIGDUO XR) 09-998 MG TB24 Take 10-2,000 mg by mouth daily with breakfast. 180 tablet 1   No current facility-administered medications for this visit.    No Known Allergies     Objective:  BP 131/80   Pulse 66   Wt 153 lb (69.4 kg)   BMI 23.26 kg/m  Gen: cooperative, not ill-appearing, no distress HEENT: normal conjunctiva, wearing glasses, neck supple, no thyromegaly or tenderness Pulm: Normal work of breathing, normal phonation, clear to auscultation bilaterally CV: Normal rate, regular rhythm, s1 and s2 distinct, no murmurs, clicks or rubs, no carotid bruit Neuro: alert and oriented x 3, EOM's intact, PERRLA, normal tone, no tremor MSK: moving all extremities, normal gait and station, no peripheral  edema Skin: right great toe with swelling, erythema and bleeding at the lateral nail fold Psych: appearance casual, mood "great", affect mood-congruent, normal speech and thought content  Diabetic Foot Exam - Simple   Simple Foot Form Diabetic Foot exam was performed with the following findings:  Yes 10/23/2016  8:58 AM  Visual Inspection Sensation Testing Pulse Check Comments     Depression screen [facility] 2/9 10/23/2016 04/28/2016  Decreased Interest 0 0  Down, Depressed, Hopeless 0 0  PHQ - 2 Score 0 0  Altered sleeping - 0  Tired, decreased energy - 0  Change in appetite - 0  Feeling bad or failure about yourself  - 0  Trouble concentrating - 1  Moving slowly or fidgety/restless - 0  Suicidal thoughts - 0  PHQ-9 Score - 1    Results for orders placed or performed in visit on 10/23/16 (from the past 72 hour(s))  POCT HgB A1C     Status: Abnormal   Collection Time: 10/23/16  9:09 AM  Result Value Ref Range   Hemoglobin A1C 9.0    No results found.    Assessment and Plan: 61 y.o. male with   1. Encounter to establish care - reviewed PMH and PSH  - Colonoscopy due  2. Type 2 diabetes mellitus with other skin complication, without long-term current use of insulin (HCC) - POCT HgB A1C 9.0, secondary to non-compliance with Metformin and Insulin. Changing patient's medication to Xigduo XR for better adherence - Foot exam performed - Patient instructed to measure morning fasting blood sugars 2-3 days per week - continue ACEi - Pneumovax UTD - Dapagliflozin-Metformin HCl ER (XIGDUO XR) 09-998 MG TB24; Take 10-2,000 mg by mouth daily with breakfast.  Dispense: 180 tablet; Refill: 1 - AMBULATORY NON FORMULARY MEDICATION; Single glucometer with lancets, test strips  Dispense: 1 each; Refill: 0  3. Paronychia of great toe, left - mild-moderate diabetic foot infection - educated patient to scrub and debride dead tissue with clean soap and water, to avoid cutting skin and nails -  ciprofloxacin (CIPRO) 750 MG tablet; Take 1 tablet (750 mg total) by mouth 2 (two) times daily.  Dispense: 20 tablet; Refill: 0 - clindamycin (CLEOCIN) 300 MG capsule; Take 1 capsule (300 mg total) by mouth 3 (three) times daily.  Dispense: 30 capsule; Refill: 0 Follow-up in 10 days with Dr. Ilsa Iha for possible toe nail removal  4. Onychomycosis due to dermatophyte - patient desires to start PO Lamisil. Will treat infection first and plan to start Lamisil after antibiotic therapy is completed  5. Coronary artery disease involving native heart without angina pectoris, unspecified vessel or lesion type - LDL not at goal <70. discontinuing Simvastatin and switching to Atorvastatin 40mg .  Recheck 8 weeks - atenolol (TENORMIN) 25 MG tablet; Take 1 tablet (25 mg total) by mouth daily.  Dispense: 90 tablet; Refill: 1 - atorvastatin (LIPITOR) 40 MG tablet; Take 1 tablet (40 mg total) by mouth daily.  Dispense: 90 tablet; Refill: 1 - Ambulatory referral to Cardiology for annual follow-up of CAD and Plavix refills  6. Anxiety and depression - PHQ2 negative, depression in remission on Fluoxetine - FLUoxetine (PROZAC) 40 MG capsule; Take 1 capsule (40 mg total) by mouth daily.  Dispense: 90 capsule; Refill: 1  7. Screening for colon cancer - Cologuard ordered  Patient education and anticipatory guidance given Patient agrees with treatment plan Follow-up in 3 months for Type II DM or sooner as needed  I spent 40 minutes with this patient, greater than 50% was face-to-face time counseling regarding the above diagnoses  Levonne Hubert PA-C

## 2016-10-23 NOTE — Patient Instructions (Addendum)
-   I have placed a referral for you to see Cardiology here in the [facility]. You will receive a phone call to schedule an appointment.  For Diabetes Start Xigduo - you can take this with your morning meal or with you evening meal, but you need to take 2 pills once a day Measure fasting blood sugar every day: goal for now is to get this to 80-130 Plan to follow-up in the office sooner if you experience low sugars  Plan to follow-up in the office sooner if your fasting sugars are consistently high Bring all sugar readings with you to your office visits Plan to recheck A1C in 3 months  For Your Toe: Take both antibiotics as prescribed for 10 days Clindamycin three times daily Cipro twice a day Warm compresses Scrub and debride dead tissue DO NOT cut your skin with scissors or nail clippers or remove your nails on your own Follow-up with Dr. Karie Schwalbe in 10 days

## 2016-10-25 ENCOUNTER — Encounter: Payer: Self-pay | Admitting: Physician Assistant

## 2016-10-26 ENCOUNTER — Encounter: Payer: Self-pay | Admitting: Physician Assistant

## 2016-10-26 DIAGNOSIS — E785 Hyperlipidemia, unspecified: Secondary | ICD-10-CM | POA: Insufficient documentation

## 2016-10-28 ENCOUNTER — Telehealth: Payer: Self-pay | Admitting: Physician Assistant

## 2016-10-28 DIAGNOSIS — IMO0002 Reserved for concepts with insufficient information to code with codable children: Secondary | ICD-10-CM

## 2016-10-28 DIAGNOSIS — E1165 Type 2 diabetes mellitus with hyperglycemia: Secondary | ICD-10-CM

## 2016-10-28 DIAGNOSIS — E11628 Type 2 diabetes mellitus with other skin complications: Secondary | ICD-10-CM

## 2016-10-28 MED ORDER — EMPAGLIFLOZIN-METFORMIN HCL ER 5-1000 MG PO TB24: 10.0000 mg | ORAL_TABLET | Freq: Every day | ORAL | 0 refills | Status: DC

## 2016-10-28 MED ORDER — EMPAGLIFLOZIN-METFORMIN HCL ER 10-1000 MG PO TB24: 10.0000 mg | ORAL_TABLET | Freq: Every day | ORAL | 0 refills | Status: DC

## 2016-10-28 NOTE — Telephone Encounter (Signed)
Prior Auth for Xigduo denied Prescription sent for Synjardy XR  Patient should take 2 tablets daily with his morning meal Instruct patient that medication will increase urinary frequency

## 2016-10-28 NOTE — Telephone Encounter (Signed)
Recommendations left on vm -EH/RMA  

## 2016-11-04 ENCOUNTER — Telehealth: Payer: Self-pay | Admitting: *Deleted

## 2016-11-04 DIAGNOSIS — E11628 Type 2 diabetes mellitus with other skin complications: Secondary | ICD-10-CM

## 2016-11-04 MED ORDER — AMBULATORY NON FORMULARY MEDICATION: 0 refills | Status: DC

## 2016-11-04 NOTE — Telephone Encounter (Signed)
[facility] pharmacy called and asked that we provide instructions on how often patient should use glucometer for billing purposes. Initially I left a message on their voicmail to use as directed but they have called back requesting specific instruction on how often patient needs to check blood sugar or insurance will not pay for glucometer or strips. A new prescription will need to be written with specific instructions.

## 2016-11-04 NOTE — Telephone Encounter (Signed)
New Rx with instructions signed and ready for fax

## 2017-01-30 ENCOUNTER — Other Ambulatory Visit: Payer: Self-pay | Admitting: Family Medicine

## 2017-01-30 DIAGNOSIS — F419 Anxiety disorder, unspecified: Principal | ICD-10-CM

## 2017-01-30 DIAGNOSIS — F329 Major depressive disorder, single episode, unspecified: Secondary | ICD-10-CM

## 2017-03-23 ENCOUNTER — Encounter: Payer: Self-pay | Admitting: Physician Assistant

## 2017-03-23 ENCOUNTER — Ambulatory Visit (INDEPENDENT_AMBULATORY_CARE_PROVIDER_SITE_OTHER): Payer: Medicare Other | Admitting: Physician Assistant

## 2017-03-23 VITALS — BP 143/76 | HR 70 | Wt 161.0 lb

## 2017-03-23 DIAGNOSIS — Z9119 Patient's noncompliance with other medical treatment and regimen: Secondary | ICD-10-CM | POA: Diagnosis not present

## 2017-03-23 DIAGNOSIS — I152 Hypertension secondary to endocrine disorders: Secondary | ICD-10-CM

## 2017-03-23 DIAGNOSIS — F419 Anxiety disorder, unspecified: Secondary | ICD-10-CM | POA: Diagnosis not present

## 2017-03-23 DIAGNOSIS — Z79899 Other long term (current) drug therapy: Secondary | ICD-10-CM | POA: Diagnosis not present

## 2017-03-23 DIAGNOSIS — E1165 Type 2 diabetes mellitus with hyperglycemia: Secondary | ICD-10-CM | POA: Diagnosis not present

## 2017-03-23 DIAGNOSIS — Z91199 Patient's noncompliance with other medical treatment and regimen due to unspecified reason: Secondary | ICD-10-CM

## 2017-03-23 DIAGNOSIS — I10 Essential (primary) hypertension: Secondary | ICD-10-CM

## 2017-03-23 DIAGNOSIS — F329 Major depressive disorder, single episode, unspecified: Secondary | ICD-10-CM

## 2017-03-23 DIAGNOSIS — E1159 Type 2 diabetes mellitus with other circulatory complications: Secondary | ICD-10-CM | POA: Diagnosis not present

## 2017-03-23 DIAGNOSIS — E1169 Type 2 diabetes mellitus with other specified complication: Secondary | ICD-10-CM

## 2017-03-23 DIAGNOSIS — Z951 Presence of aortocoronary bypass graft: Secondary | ICD-10-CM

## 2017-03-23 DIAGNOSIS — E785 Hyperlipidemia, unspecified: Secondary | ICD-10-CM

## 2017-03-23 DIAGNOSIS — I2510 Atherosclerotic heart disease of native coronary artery without angina pectoris: Secondary | ICD-10-CM

## 2017-03-23 DIAGNOSIS — Z23 Encounter for immunization: Secondary | ICD-10-CM

## 2017-03-23 DIAGNOSIS — Z135 Encounter for screening for eye and ear disorders: Secondary | ICD-10-CM | POA: Diagnosis not present

## 2017-03-23 LAB — POCT GLYCOSYLATED HEMOGLOBIN (HGB A1C): Hemoglobin A1C: 10.7

## 2017-03-23 MED ORDER — ATORVASTATIN CALCIUM 40 MG PO TABS
40.0000 mg | ORAL_TABLET | Freq: Every day | ORAL | 3 refills | Status: DC
Start: 2017-03-23 — End: 2017-03-25

## 2017-03-23 MED ORDER — FLUOXETINE HCL 40 MG PO CAPS: 40.0000 mg | ORAL_CAPSULE | Freq: Every day | ORAL | 1 refills | Status: DC

## 2017-03-23 MED ORDER — CLOPIDOGREL BISULFATE 75 MG PO TABS: 75.0000 mg | ORAL_TABLET | Freq: Every day | ORAL | 3 refills | Status: DC

## 2017-03-23 MED ORDER — EMPAGLIFLOZIN-METFORMIN HCL ER 12.5-1000 MG PO TB24: 2.0000 | ORAL_TABLET | Freq: Every day | ORAL | 0 refills | Status: DC

## 2017-03-23 MED ORDER — ATENOLOL 25 MG PO TABS: 25.0000 mg | ORAL_TABLET | Freq: Every day | ORAL | 1 refills | Status: DC

## 2017-03-23 MED ORDER — LISINOPRIL 10 MG PO TABS: 10.0000 mg | ORAL_TABLET | Freq: Every day | ORAL | 5 refills | Status: DC

## 2017-03-23 NOTE — Patient Instructions (Addendum)
- Stop Aspirin - Re-start your Plavix - Follow-up with Cardiology  For your blood pressure: - Start Lisinopril every morning with your other medications. Continue your Atenolol - Limit salt to <2000 mg/day - Follow DASH eating plan - limit alcohol to 2 standard drinks per day - avoid tobacco products - weight loss: 7% of current body weight - Follow-up in 2 weeks for nurse BP check Then follow-up every 6 months  For your diabetes - take 2 tablets of Metformin with breakfast in the morning - limit carbs to 30g at meals and 15g at snacks - schedule your diabetic eye exam  Carbohydrate Counting for Diabetes Mellitus, Adult Carbohydrate counting is a method for keeping track of how many carbohydrates you eat. Eating carbohydrates naturally increases the amount of sugar (glucose) in the blood. Counting how many carbohydrates you eat helps keep your blood glucose within normal limits, which helps you manage your diabetes (diabetes mellitus). It is important to know how many carbohydrates you can safely have in each meal. This is different for every person. A diet and nutrition specialist (registered dietitian) can help you make a meal plan and calculate how many carbohydrates you should have at each meal and snack. Carbohydrates are found in the following foods:  Grains, such as breads and cereals.  Dried beans and soy products.  Starchy vegetables, such as potatoes, peas, and corn.  Fruit and fruit juices.  Milk and yogurt.  Sweets and snack foods, such as cake, cookies, candy, chips, and soft drinks.  How do I count carbohydrates? There are two ways to count carbohydrates in food. You can use either of the methods or a combination of both. Reading "Nutrition Facts" on packaged food The "Nutrition Facts" list is included on the labels of almost all packaged foods and beverages in the [place] It includes:  The serving size.  Information about nutrients in each serving, including the  grams (g) of carbohydrate per serving.  To use the "Nutrition Facts":  Decide how many servings you will have.  Multiply the number of servings by the number of carbohydrates per serving.  The resulting number is the total amount of carbohydrates that you will be having.  Learning standard serving sizes of other foods When you eat foods containing carbohydrates that are not packaged or do not include "Nutrition Facts" on the label, you need to measure the servings in order to count the amount of carbohydrates:  Measure the foods that you will eat with a food scale or measuring cup, if needed.  Decide how many standard-size servings you will eat.  Multiply the number of servings by 15. Most carbohydrate-rich foods have about 15 g of carbohydrates per serving. ? For example, if you eat 8 oz (170 g) of strawberries, you will have eaten 2 servings and 30 g of carbohydrates (2 servings x 15 g = 30 g).  For foods that have more than one food mixed, such as soups and casseroles, you must count the carbohydrates in each food that is included.  The following list contains standard serving sizes of common carbohydrate-rich foods. Each of these servings has about 15 g of carbohydrates:   hamburger bun or  English muffin.   oz (15 mL) syrup.   oz (14 g) jelly.  1 slice of bread.  1 six-inch tortilla.  3 oz (85 g) cooked Mazzaferro or pasta.  4 oz (113 g) cooked dried beans.  4 oz (113 g) starchy vegetable, such as peas, corn, or  potatoes.  4 oz (113 g) hot cereal.  4 oz (113 g) mashed potatoes or  of a large baked potato.  4 oz (113 g) canned or frozen fruit.  4 oz (120 mL) fruit juice.  4-6 crackers.  6 chicken nuggets.  6 oz (170 g) unsweetened dry cereal.  6 oz (170 g) plain fat-free yogurt or yogurt sweetened with artificial sweeteners.  8 oz (240 mL) milk.  8 oz (170 g) fresh fruit or one small piece of fruit.  24 oz (680 g) popped popcorn.  Example of  carbohydrate counting Sample meal  3 oz (85 g) chicken breast.  6 oz (170 g) brown [place].  4 oz (113 g) corn.  8 oz (240 mL) milk.  8 oz (170 g) strawberries with sugar-free whipped topping. Carbohydrate calculation 1. Identify the foods that contain carbohydrates: ? Agrawal. ? Corn. ? Milk. ? Strawberries. 2. Calculate how many servings you have of each food: ? 2 servings [place]. ? 1 serving corn. ? 1 serving milk. ? 1 serving strawberries. 3. Multiply each number of servings by 15 g: ? 2 servings [place] x 15 g = 30 g. ? 1 serving corn x 15 g = 15 g. ? 1 serving milk x 15 g = 15 g. ? 1 serving strawberries x 15 g = 15 g. 4. Add together all of the amounts to find the total grams of carbohydrates eaten: ? 30 g + 15 g + 15 g + 15 g = 75 g of carbohydrates total. This information is not intended to replace advice given to you by your health care provider. Make sure you discuss any questions you have with your health care provider. Document Released: 05/11/2005 Document Revised: 11/29/2015 Document Reviewed: 10/23/2015 Elsevier Interactive Patient Education  [employer].

## 2017-03-23 NOTE — Progress Notes (Signed)
HPI:                                                                Mark Maynard is a 61 y.o. male who presents to [name]: [facility] Medicine today for medication management  DMII: ran out of his Synjardy 2 months ago. Missed his follow-up appointment. Last A1C 9.0, 5 months ago.  Denies polyuria, vision change, and paresthesias. Denies hypoglycemic events. Denies ulcers/wounds on feet. Completed antibiotic therapy and reports paronychia resolved.  Eye exam: overdue Foot exam: UTD  CAD: did not follow-up with Cardiology. Has been out of Plavix for 5 months. He started himself on Asprin 325 mg. Denies exertional chest pain. Compliant with his statin.  Depression/Anxiety: taking Fluoxetine without difficulty. Denies depressed mood or anhedonia. Denies symptoms of mania/hypomania. Denies suicidal thinking. Denies auditory/visual hallucinations.  Past Medical History:  Diagnosis Date  . Anxiety   . CAD (coronary artery disease)   . Depression   . Diabetes mellitus without complication (HCC)   . Hyperlipidemia   . Hypertension   . Myocardial infarction (HCC) 2012   x 3, s/p CABG, 9 stents   Past Surgical History:  Procedure Laterality Date  . CORONARY ARTERY BYPASS GRAFT  2012  . CORONARY ARTERY BYPASS GRAFT    . EYE SURGERY  1980's   ptyrgium removal   Social History  Substance Use Topics  . Smoking status: Never Smoker  . Smokeless tobacco: Never Used  . Alcohol use 0.6 oz/week    1 Cans of beer per week   family history includes Melanoma in his father.  ROS: negative except as noted in the HPI  Medications: Current Outpatient Prescriptions  Medication Sig Dispense Refill  . AMBULATORY NON FORMULARY MEDICATION Single glucometer with lancets, test strips.   Check fasting morning blood sugar QD. Please bill with insurance. 1 each 0  . atenolol (TENORMIN) 25 MG tablet Take 1 tablet (25 mg total) by mouth daily. 90 tablet 1  . atorvastatin  (LIPITOR) 40 MG tablet Take 1 tablet (40 mg total) by mouth daily. 90 tablet 3  . clopidogrel (PLAVIX) 75 MG tablet Take 1 tablet (75 mg total) by mouth daily. 90 tablet 3  . Empagliflozin-Metformin HCl ER (SYNJARDY XR) 12.09-998 MG TB24 Take 2 tablets by mouth daily with breakfast. 180 tablet 0  . FLUoxetine (PROZAC) 40 MG capsule Take 1 capsule (40 mg total) by mouth daily. 90 capsule 1  . lisinopril (PRINIVIL,ZESTRIL) 10 MG tablet Take 1 tablet (10 mg total) by mouth daily. 30 tablet 5   No current facility-administered medications for this visit.    No Known Allergies     Objective:  BP (!) 143/76   Pulse 70   Wt 161 lb (73 kg)   BMI 24.48 kg/m  Gen:  alert, not ill-appearing, no distress, appropriate for age HEENT: head normocephalic without obvious abnormality, conjunctiva and cornea clear, wearing glasses, trachea midline Pulm: Normal work of breathing, normal phonation, clear to auscultation bilaterally, no wheezes, rales or rhonchi CV: Normal rate, regular rhythm, s1 and s2 distinct, no murmurs, clicks or rubs, no carotid bruit  Neuro: alert and oriented x 3, no tremor MSK: extremities atraumatic, normal gait and station Skin: intact, no rashes on exposed skin,  no jaundice, no cyanosis Psych: well-groomed, cooperative, good eye contact, euthymic mood, affect mood-congruent, speech is articulate, and thought processes clear and goal-directed  [facility] 10/23/2016 04/28/2016  Decreased Interest 0 0  Down, Depressed, Hopeless 0 0  PHQ - 2 Score 0 0  Altered sleeping - 0  Tired, decreased energy - 0  Change in appetite - 0  Feeling bad or failure about yourself  - 0  Trouble concentrating - 1  Moving slowly or fidgety/restless - 0  Suicidal thoughts - 0  PHQ-9 Score - 1     Results for orders placed or performed in visit on 03/23/17 (from the past 72 hour(s))  POCT HgB A1C     Status: Abnormal   Collection Time: 03/23/17  2:35 PM  Result Value Ref Range    Hemoglobin A1C 10.7    No results found.     Assessment and Plan: 61 y.o. male with   1. Encounter for long-term current use of medication   2. Uncontrolled type 2 diabetes mellitus with hyperglycemia, without long-term current use of insulin (HCC) - POCT HgB A1C 10.4 today, worsened compared to 5 months ago. This is likely due to medication noncompliance / lack of follow-up. Patient understands he is to follow-up every 3 months for his uncontrolled diabetes - increasing Synjardy - influenza vaccine given today. Pneumovax UTD - referral placed for diabetic eye exam - on statin - starting ACE - Empagliflozin-Metformin HCl ER (SYNJARDY XR) 12.09-998 MG TB24; Take 2 tablets by mouth daily with breakfast.  Dispense: 180 tablet; Refill: 0 - Ambulatory referral to Ophthalmology   3. Hypertension associated with diabetes (HCC) BP Readings from Last 3 Encounters:  03/23/17 (!) 143/76  10/23/16 131/80  05/04/16 178/96  - starting ACE. Continue beta blocker - therapeutic lifestyle changes - lisinopril (PRINIVIL,ZESTRIL) 10 MG tablet; Take 1 tablet (10 mg total) by mouth daily.  Dispense: 30 tablet; Refill: 5  4. Hyperlipidemia associated with type 2 diabetes mellitus (HCC) - never went for his fasting lipid panel in June - last LDL 91 07/11/15 - goal LDL <30<70  5. Hx of CABG - discussed that given his complex history of multiple CABG and stents, he needs to be followed by a Cardiologist at least yearly. Agree to fill Plavix today. Instructed to dc Aspirin - clopidogrel (PLAVIX) 75 MG tablet; Take 1 tablet (75 mg total) by mouth daily.  Dispense: 90 tablet; Refill: 3  6. Coronary artery disease involving native heart without angina pectoris, unspecified vessel or lesion type - atorvastatin (LIPITOR) 40 MG tablet; Take 1 tablet (40 mg total) by mouth daily.  Dispense: 90 tablet; Refill: 3 - atenolol (TENORMIN) 25 MG tablet; Take 1 tablet (25 mg total) by mouth daily.  Dispense: 90  tablet; Refill: 1 - Ambulatory referral to Cardiology  7. Anxiety and depression - FLUoxetine (PROZAC) 40 MG capsule; Take 1 capsule (40 mg total) by mouth daily.  Dispense: 90 capsule; Refill: 1  8. Screening for diabetic retinopathy - Ambulatory referral to Ophthalmology  9. Need for immunization against influenza - Flu Vaccine QUAD 36+ mos IM  10. Patient noncompliance, general - denies financial, transportation, or physical disability barriers - despite known CAD and multiple AMI, patient has refused to follow-up with Cardiology. Referral placed again today. Patient understands risks - 2 months late for his diabetes follow-up. Patient understands he needs to return in 3 months  Patient education and anticipatory guidance given Patient agrees with treatment plan Follow-upin 3 months  for diabetes or sooner as needed if symptoms worsen or fail to improve  Levonne Hubert PA-C

## 2017-03-24 ENCOUNTER — Encounter: Payer: Self-pay | Admitting: Physician Assistant

## 2017-03-24 DIAGNOSIS — Z91199 Patient's noncompliance with other medical treatment and regimen due to unspecified reason: Secondary | ICD-10-CM | POA: Insufficient documentation

## 2017-03-24 DIAGNOSIS — E785 Hyperlipidemia, unspecified: Secondary | ICD-10-CM

## 2017-03-24 DIAGNOSIS — Z9119 Patient's noncompliance with other medical treatment and regimen: Secondary | ICD-10-CM | POA: Insufficient documentation

## 2017-03-24 DIAGNOSIS — E1169 Type 2 diabetes mellitus with other specified complication: Secondary | ICD-10-CM | POA: Insufficient documentation

## 2017-03-25 ENCOUNTER — Other Ambulatory Visit: Payer: Self-pay

## 2017-03-25 DIAGNOSIS — F419 Anxiety disorder, unspecified: Secondary | ICD-10-CM

## 2017-03-25 DIAGNOSIS — I10 Essential (primary) hypertension: Secondary | ICD-10-CM

## 2017-03-25 DIAGNOSIS — E1159 Type 2 diabetes mellitus with other circulatory complications: Secondary | ICD-10-CM

## 2017-03-25 DIAGNOSIS — Z951 Presence of aortocoronary bypass graft: Secondary | ICD-10-CM

## 2017-03-25 DIAGNOSIS — E1165 Type 2 diabetes mellitus with hyperglycemia: Secondary | ICD-10-CM

## 2017-03-25 DIAGNOSIS — I2510 Atherosclerotic heart disease of native coronary artery without angina pectoris: Secondary | ICD-10-CM

## 2017-03-25 DIAGNOSIS — F329 Major depressive disorder, single episode, unspecified: Secondary | ICD-10-CM

## 2017-03-25 MED ORDER — ATORVASTATIN CALCIUM 40 MG PO TABS: 40.0000 mg | ORAL_TABLET | Freq: Every day | ORAL | 3 refills | Status: DC

## 2017-03-25 MED ORDER — EMPAGLIFLOZIN-METFORMIN HCL ER 12.5-1000 MG PO TB24: 2.0000 | ORAL_TABLET | Freq: Every day | ORAL | 0 refills | Status: AC

## 2017-03-25 MED ORDER — LISINOPRIL 10 MG PO TABS: 10.0000 mg | ORAL_TABLET | Freq: Every day | ORAL | 5 refills | Status: DC

## 2017-03-25 MED ORDER — FLUOXETINE HCL 40 MG PO CAPS: 40.0000 mg | ORAL_CAPSULE | Freq: Every day | ORAL | 1 refills | Status: DC

## 2017-03-25 MED ORDER — CLOPIDOGREL BISULFATE 75 MG PO TABS: 75.0000 mg | ORAL_TABLET | Freq: Every day | ORAL | 3 refills | Status: DC

## 2017-03-25 MED ORDER — ATENOLOL 25 MG PO TABS: 25.0000 mg | ORAL_TABLET | Freq: Every day | ORAL | 1 refills | Status: DC

## 2017-04-06 ENCOUNTER — Ambulatory Visit: Payer: TRICARE For Life (TFL)

## 2017-06-25 HISTORY — PX: PERCUTANEOUS CORONARY STENT INTERVENTION (PCI-S): SHX6016

## 2017-07-10 DIAGNOSIS — Z833 Family history of diabetes mellitus: Secondary | ICD-10-CM | POA: Diagnosis not present

## 2017-07-10 DIAGNOSIS — Z955 Presence of coronary angioplasty implant and graft: Secondary | ICD-10-CM | POA: Diagnosis not present

## 2017-07-10 DIAGNOSIS — Z794 Long term (current) use of insulin: Secondary | ICD-10-CM | POA: Diagnosis not present

## 2017-07-10 DIAGNOSIS — N179 Acute kidney failure, unspecified: Secondary | ICD-10-CM | POA: Diagnosis present

## 2017-07-10 DIAGNOSIS — E119 Type 2 diabetes mellitus without complications: Secondary | ICD-10-CM | POA: Diagnosis not present

## 2017-07-10 DIAGNOSIS — E1110 Type 2 diabetes mellitus with ketoacidosis without coma: Secondary | ICD-10-CM | POA: Diagnosis not present

## 2017-07-10 DIAGNOSIS — Z808 Family history of malignant neoplasm of other organs or systems: Secondary | ICD-10-CM | POA: Diagnosis not present

## 2017-07-10 DIAGNOSIS — I25118 Atherosclerotic heart disease of native coronary artery with other forms of angina pectoris: Secondary | ICD-10-CM | POA: Diagnosis present

## 2017-07-10 DIAGNOSIS — E782 Mixed hyperlipidemia: Secondary | ICD-10-CM | POA: Diagnosis not present

## 2017-07-10 DIAGNOSIS — R1010 Upper abdominal pain, unspecified: Secondary | ICD-10-CM | POA: Diagnosis present

## 2017-07-10 DIAGNOSIS — I517 Cardiomegaly: Secondary | ICD-10-CM | POA: Diagnosis not present

## 2017-07-10 DIAGNOSIS — I10 Essential (primary) hypertension: Secondary | ICD-10-CM | POA: Diagnosis present

## 2017-07-10 DIAGNOSIS — D72829 Elevated white blood cell count, unspecified: Secondary | ICD-10-CM | POA: Diagnosis not present

## 2017-07-10 DIAGNOSIS — I25810 Atherosclerosis of coronary artery bypass graft(s) without angina pectoris: Secondary | ICD-10-CM | POA: Diagnosis not present

## 2017-07-10 DIAGNOSIS — I083 Combined rheumatic disorders of mitral, aortic and tricuspid valves: Secondary | ICD-10-CM | POA: Diagnosis not present

## 2017-07-10 DIAGNOSIS — Z79899 Other long term (current) drug therapy: Secondary | ICD-10-CM | POA: Diagnosis not present

## 2017-07-10 DIAGNOSIS — I25798 Atherosclerosis of other coronary artery bypass graft(s) with other forms of angina pectoris: Secondary | ICD-10-CM | POA: Diagnosis not present

## 2017-07-10 DIAGNOSIS — E785 Hyperlipidemia, unspecified: Secondary | ICD-10-CM | POA: Diagnosis not present

## 2017-07-10 DIAGNOSIS — E1165 Type 2 diabetes mellitus with hyperglycemia: Secondary | ICD-10-CM | POA: Diagnosis not present

## 2017-07-10 DIAGNOSIS — R079 Chest pain, unspecified: Secondary | ICD-10-CM | POA: Diagnosis not present

## 2017-07-10 DIAGNOSIS — Z951 Presence of aortocoronary bypass graft: Secondary | ICD-10-CM | POA: Diagnosis not present

## 2017-07-10 DIAGNOSIS — I214 Non-ST elevation (NSTEMI) myocardial infarction: Secondary | ICD-10-CM | POA: Diagnosis present

## 2017-07-10 DIAGNOSIS — Z7902 Long term (current) use of antithrombotics/antiplatelets: Secondary | ICD-10-CM | POA: Diagnosis not present

## 2017-07-10 DIAGNOSIS — I259 Chronic ischemic heart disease, unspecified: Secondary | ICD-10-CM | POA: Diagnosis not present

## 2017-07-10 DIAGNOSIS — I2510 Atherosclerotic heart disease of native coronary artery without angina pectoris: Secondary | ICD-10-CM | POA: Diagnosis not present

## 2017-07-10 DIAGNOSIS — I25708 Atherosclerosis of coronary artery bypass graft(s), unspecified, with other forms of angina pectoris: Secondary | ICD-10-CM | POA: Diagnosis not present

## 2017-07-12 DIAGNOSIS — I214 Non-ST elevation (NSTEMI) myocardial infarction: Secondary | ICD-10-CM | POA: Diagnosis not present

## 2017-07-12 DIAGNOSIS — I2510 Atherosclerotic heart disease of native coronary artery without angina pectoris: Secondary | ICD-10-CM | POA: Diagnosis not present

## 2017-07-12 DIAGNOSIS — I10 Essential (primary) hypertension: Secondary | ICD-10-CM | POA: Diagnosis not present

## 2017-07-12 DIAGNOSIS — I25810 Atherosclerosis of coronary artery bypass graft(s) without angina pectoris: Secondary | ICD-10-CM | POA: Diagnosis not present

## 2017-07-12 DIAGNOSIS — E119 Type 2 diabetes mellitus without complications: Secondary | ICD-10-CM | POA: Diagnosis not present

## 2017-07-12 DIAGNOSIS — E785 Hyperlipidemia, unspecified: Secondary | ICD-10-CM | POA: Diagnosis not present

## 2017-07-12 DIAGNOSIS — Z951 Presence of aortocoronary bypass graft: Secondary | ICD-10-CM | POA: Diagnosis not present

## 2017-07-13 DIAGNOSIS — I2510 Atherosclerotic heart disease of native coronary artery without angina pectoris: Secondary | ICD-10-CM | POA: Diagnosis not present

## 2017-07-13 DIAGNOSIS — Z951 Presence of aortocoronary bypass graft: Secondary | ICD-10-CM | POA: Diagnosis not present

## 2017-07-13 DIAGNOSIS — I10 Essential (primary) hypertension: Secondary | ICD-10-CM | POA: Diagnosis not present

## 2017-07-13 DIAGNOSIS — E119 Type 2 diabetes mellitus without complications: Secondary | ICD-10-CM | POA: Diagnosis not present

## 2017-07-13 DIAGNOSIS — E785 Hyperlipidemia, unspecified: Secondary | ICD-10-CM | POA: Diagnosis not present

## 2017-07-13 DIAGNOSIS — I214 Non-ST elevation (NSTEMI) myocardial infarction: Secondary | ICD-10-CM | POA: Diagnosis not present

## 2017-07-16 ENCOUNTER — Ambulatory Visit (INDEPENDENT_AMBULATORY_CARE_PROVIDER_SITE_OTHER): Payer: Medicare Other | Admitting: Physician Assistant

## 2017-07-16 ENCOUNTER — Encounter: Payer: Self-pay | Admitting: Physician Assistant

## 2017-07-16 VITALS — BP 103/58 | HR 66 | Temp 97.9°F | Wt 144.0 lb

## 2017-07-16 DIAGNOSIS — K219 Gastro-esophageal reflux disease without esophagitis: Secondary | ICD-10-CM | POA: Diagnosis not present

## 2017-07-16 DIAGNOSIS — I252 Old myocardial infarction: Secondary | ICD-10-CM | POA: Diagnosis not present

## 2017-07-16 DIAGNOSIS — E1151 Type 2 diabetes mellitus with diabetic peripheral angiopathy without gangrene: Secondary | ICD-10-CM

## 2017-07-16 DIAGNOSIS — IMO0002 Reserved for concepts with insufficient information to code with codable children: Secondary | ICD-10-CM

## 2017-07-16 DIAGNOSIS — Z09 Encounter for follow-up examination after completed treatment for conditions other than malignant neoplasm: Secondary | ICD-10-CM

## 2017-07-16 DIAGNOSIS — F419 Anxiety disorder, unspecified: Secondary | ICD-10-CM | POA: Diagnosis not present

## 2017-07-16 DIAGNOSIS — F329 Major depressive disorder, single episode, unspecified: Secondary | ICD-10-CM

## 2017-07-16 DIAGNOSIS — E1165 Type 2 diabetes mellitus with hyperglycemia: Secondary | ICD-10-CM

## 2017-07-16 DIAGNOSIS — I2510 Atherosclerotic heart disease of native coronary artery without angina pectoris: Secondary | ICD-10-CM | POA: Diagnosis not present

## 2017-07-16 LAB — GLUCOSE, POCT (MANUAL RESULT ENTRY): POC GLUCOSE: 131 mg/dL — AB (ref 70–99)

## 2017-07-16 LAB — POCT GLYCOSYLATED HEMOGLOBIN (HGB A1C): Hemoglobin A1C: 6.8

## 2017-07-16 MED ORDER — ATORVASTATIN CALCIUM 80 MG PO TABS: 80.0000 mg | ORAL_TABLET | Freq: Every day | ORAL | 3 refills | Status: DC

## 2017-07-16 MED ORDER — PANTOPRAZOLE SODIUM 20 MG PO TBEC: 20.0000 mg | DELAYED_RELEASE_TABLET | Freq: Every day | ORAL | 0 refills | Status: DC

## 2017-07-16 MED ORDER — ISOSORBIDE MONONITRATE ER 30 MG PO TB24: 30.0000 mg | ORAL_TABLET | Freq: Every day | ORAL | 3 refills | Status: AC

## 2017-07-16 MED ORDER — AMBULATORY NON FORMULARY MEDICATION: 0 refills | Status: AC

## 2017-07-16 MED ORDER — ATENOLOL 25 MG PO TABS: 25.0000 mg | ORAL_TABLET | Freq: Every day | ORAL | 0 refills | Status: DC

## 2017-07-16 MED ORDER — FLUOXETINE HCL 40 MG PO CAPS: 40.0000 mg | ORAL_CAPSULE | Freq: Every day | ORAL | 1 refills | Status: DC

## 2017-07-16 MED ORDER — NITROGLYCERIN 0.4 MG SL SUBL: 0.4000 mg | SUBLINGUAL_TABLET | SUBLINGUAL | 0 refills | Status: AC | PRN

## 2017-07-16 MED ORDER — INSULIN PEN NEEDLE 32G X 4 MM MISC: 1 refills | Status: DC

## 2017-07-16 MED ORDER — ASPIRIN EC 81 MG PO TBEC
81.0000 mg | DELAYED_RELEASE_TABLET | Freq: Every day | ORAL | 3 refills | Status: AC
Start: 2017-07-16 — End: ?

## 2017-07-16 MED ORDER — INSULIN DEGLUDEC 100 UNIT/ML ~~LOC~~ SOPN
22.0000 [IU] | PEN_INJECTOR | Freq: Every day | SUBCUTANEOUS | 3 refills | Status: DC
Start: 2017-07-16 — End: 2017-09-13

## 2017-07-16 MED ORDER — EMPAGLIFLOZIN-METFORMIN HCL ER 12.5-1000 MG PO TB24: 2.0000 | ORAL_TABLET | Freq: Every day | ORAL | 1 refills | Status: DC

## 2017-07-16 MED ORDER — CLOPIDOGREL BISULFATE 75 MG PO TABS: 75.0000 mg | ORAL_TABLET | Freq: Every day | ORAL | 3 refills | Status: DC

## 2017-07-16 NOTE — Progress Notes (Signed)
HPI:                                                                Mark Maynard. is a 62 y.o. male who presents to [facility]: Primary Care Sports Medicine today for hospital follow-up  Patient with PMH of poorly controlled DM2, HTN, CAD, hx of CABG, anxiety and depression presents today for hospital follow-up for DKA and NSTEMI on 07/10/16. He was discharged on Lantus 22 Units QHS and Novolog 2-3 Units 15 minutes before meals. He underwent a left heart cath with PTCA/stent placement on 07/12/17, which showed severe native three-vessel CAD with normal LVEF (55%). He was continued on Aspirin 81 mg and Plavix 75 mg and started on long-acting nitrate.  He is accompanied by his partner today, who reports he has not been compliant with his medication regimen since hospital discharge. He threw away his glucometer at some point, so he has no way of monitoring his blood glucose to dose his insulin. He also combined all of his oral medications into one bottle and does not know what anything is.  He denies fever, chills, abdominal pain, cough, dyspnea, chest pain, or claudication.     No flowsheet data found.    Past Medical History:  Diagnosis Date  . Anxiety   . CAD (coronary artery disease)   . Depression   . Diabetes mellitus without complication (HCC)   . Hyperlipidemia   . Hypertension   . Myocardial infarction (HCC) 2012   x 3, s/p CABG, 9 stents   Past Surgical History:  Procedure Laterality Date  . CORONARY ARTERY BYPASS GRAFT  2012  . CORONARY ARTERY BYPASS GRAFT    . EYE SURGERY  1980's   ptyrgium removal   Social History   Tobacco Use  . Smoking status: Never Smoker  . Smokeless tobacco: Never Used  Substance Use Topics  . Alcohol use: Yes    Alcohol/week: 0.6 oz    Types: 1 Cans of beer per week   family history includes Melanoma in his father.    ROS: negative except as noted in the HPI  Medications: Current Outpatient Medications   Medication Sig Dispense Refill  . acetaminophen (TYLENOL) 325 MG tablet Take 650 mg by mouth every 6 (six) hours as needed.    . AMBULATORY NON FORMULARY MEDICATION Single glucometer with lancets, test strips.   Check fasting morning blood sugar QD. Please bill with insurance. 1 each 0  . aspirin EC 81 MG tablet Take 1 tablet (81 mg total) by mouth daily. 90 tablet 3  . atenolol (TENORMIN) 25 MG tablet Take 1 tablet (25 mg total) by mouth daily. 90 tablet 0  . atorvastatin (LIPITOR) 80 MG tablet Take 1 tablet (80 mg total) by mouth daily. 90 tablet 3  . clopidogrel (PLAVIX) 75 MG tablet Take 1 tablet (75 mg total) by mouth daily. 90 tablet 3  . Empagliflozin-metFORMIN HCl ER (SYNJARDY XR) 12.09-998 MG TB24 Take 2 tablets by mouth daily with breakfast. 180 tablet 1  . FLUoxetine (PROZAC) 40 MG capsule Take 1 capsule (40 mg total) by mouth daily. 90 capsule 1  . insulin degludec (TRESIBA FLEXTOUCH) 100 UNIT/ML SOPN FlexTouch Pen Inject 0.22 mLs (22 Units total) into the skin at  bedtime. 3 mL 3  . Insulin Pen Needle (NOVOFINE PLUS) 32G X 4 MM MISC For daily use with Tresiba pen 90 each 1  . Insulin Pen Needle (RELION PEN NEEDLES) 32G X 4 MM MISC Use as directed 100 each 1  . isosorbide mononitrate (IMDUR) 30 MG 24 hr tablet Take 1 tablet (30 mg total) by mouth daily. 90 tablet 3  . nitroGLYCERIN (NITROSTAT) 0.4 MG SL tablet Place 1 tablet (0.4 mg total) under the tongue every 5 (five) minutes as needed for chest pain (or tightness). 30 tablet 0  . pantoprazole (PROTONIX) 20 MG tablet Take 1 tablet (20 mg total) by mouth daily. 90 tablet 0   No current facility-administered medications for this visit.    No Known Allergies     Objective:  BP (!) 103/58   Pulse 66   Temp 97.9 F (36.6 C)   Wt 144 lb (65.3 kg)   SpO2 98%   BMI 21.90 kg/m  Gen:  alert, not ill-appearing, no distress, appropriate for age HEENT: head normocephalic without obvious abnormality, conjunctiva and cornea clear,  wearing glasses, trachea midline Pulm: Normal work of breathing, normal phonation, clear to auscultation bilaterally, no wheezes, rales or rhonchi CV: Normal rate, regular rhythm, s1 and s2 distinct, no murmurs, clicks or rubs  Neuro: alert and oriented x 3, no tremor MSK: extremities atraumatic, normal gait and station, no peripheral edema Skin: intact, no rashes on exposed skin, no jaundice, no cyanosis Psych: well-groomed, cooperative, good eye contact, euthymic mood, affect mood-congruent, speech is articulate, and thought processes clear and goal-directed    No results found for this or any previous visit (from the past 72 hour(s)). No results found.    Assessment and Plan: 62 y.o. male with    Coronary artery disease involving native heart without angina pectoris, unspecified vessel or lesion type - refills provided, but patient understands he needs to keep follow-up with Cardiology - clopidogrel (PLAVIX) 75 MG tablet; Take 1 tablet (75 mg total) by mouth daily.  Dispense: 90 tablet; Refill: 3 - atorvastatin (LIPITOR) 80 MG tablet; Take 1 tablet (80 mg total) by mouth daily.  Dispense: 90 tablet; Refill: 3 - aspirin EC 81 MG tablet; Take 1 tablet (81 mg total) by mouth daily.  Dispense: 90 tablet; Refill: 3 - atenolol (TENORMIN) 25 MG tablet; Take 1 tablet (25 mg total) by mouth daily.  Dispense: 90 tablet; Refill: 0 - isosorbide mononitrate (IMDUR) 30 MG 24 hr tablet; Take 1 tablet (30 mg total) by mouth daily.  Dispense: 90 tablet; Refill: 3 - nitroGLYCERIN (NITROSTAT) 0.4 MG SL tablet; Place 1 tablet (0.4 mg total) under the tongue every 5 (five) minutes as needed for chest pain (or tightness).  Dispense: 30 tablet; Refill: 0  Anxiety and depression - FLUoxetine (PROZAC) 40 MG capsule; Take 1 capsule (40 mg total) by mouth daily.  Dispense: 90 capsule; Refill: 1  Uncontrolled diabetes mellitus type 2 with peripheral artery disease (HCC) - stopping Novolog due to risk of  hypoglycemia. Switching from Lantus to [place] for compliance (patient prefers a pen). Prescription for glucometer sent. Counseled that he should never administer insulin without checking his glucose level. Counseled on how to correct for hypoglycemia. Close follow-up in 8 weeks  - Empagliflozin-metFORMIN HCl ER (SYNJARDY XR) 12.09-998 MG TB24; Take 2 tablets by mouth daily with breakfast.  Dispense: 180 tablet; Refill: 1 - insulin degludec (TRESIBA FLEXTOUCH) 100 UNIT/ML SOPN FlexTouch Pen; Inject 0.22 mLs (22 Units total) into the skin  at bedtime.  Dispense: 3 mL; Refill: 3 - AMBULATORY NON FORMULARY MEDICATION; Single glucometer with lancets, test strips.   Check fasting morning blood sugar QD. Please bill with insurance.  Dispense: 1 each; Refill: 0 - Insulin Pen Needle (NOVOFINE PLUS) 32G X 4 MM MISC; For daily use with [place] pen  Dispense: 90 each; Refill: 1 - POCT HgB A1C - POCT Glucose (CBG)  Gastroesophageal reflux disease, esophagitis presence not specified - pantoprazole (PROTONIX) 20 MG tablet; Take 1 tablet (20 mg total) by mouth daily.  Dispense: 90 tablet; Refill: 0  Hospital discharge follow-up - vitals reviewed and stable - personally reviewed hospital labs, diagnostics and A&P from 07/10/17 and 07/12/17 - refilled all of his medications today  Patient education and anticipatory guidance given Patient agrees with treatment plan Follow-up in 8 weeks for diabetes or sooner as needed if symptoms worsen or fail to improve  I spent 40 minutes with this patient, greater than 50% was face-to-face time counseling regarding the above diagnoses  Levonne Hubertharley E. Antawn Sison PA-C

## 2017-07-16 NOTE — Patient Instructions (Addendum)
For Diabetes Continue your Kirk RuthsSynjardy as you are taking it Measure your blood sugar before giving yourself insulin. Do not administer insulin if blood sugar is less than 80 Continue Tresiba 22 units at bedtime Measure fasting blood sugar every morning: goal for now is to get maintain this at 80-130 Follow-up in 8 weeks Plan to follow-up in the office sooner if you experience low sugars  Plan to follow-up in the office sooner if your fasting sugars are consistently high Bring all sugar readings with you to your office visits

## 2017-07-21 ENCOUNTER — Other Ambulatory Visit: Payer: Self-pay

## 2017-07-21 MED ORDER — INSULIN PEN NEEDLE 32G X 4 MM MISC: 1 refills | Status: DC

## 2017-07-25 ENCOUNTER — Encounter: Payer: Self-pay | Admitting: Physician Assistant

## 2017-07-25 DIAGNOSIS — K219 Gastro-esophageal reflux disease without esophagitis: Secondary | ICD-10-CM | POA: Insufficient documentation

## 2017-08-09 DIAGNOSIS — E785 Hyperlipidemia, unspecified: Secondary | ICD-10-CM | POA: Diagnosis not present

## 2017-08-09 DIAGNOSIS — R0602 Shortness of breath: Secondary | ICD-10-CM | POA: Diagnosis not present

## 2017-08-09 DIAGNOSIS — I10 Essential (primary) hypertension: Secondary | ICD-10-CM | POA: Diagnosis not present

## 2017-08-09 DIAGNOSIS — I25708 Atherosclerosis of coronary artery bypass graft(s), unspecified, with other forms of angina pectoris: Secondary | ICD-10-CM | POA: Diagnosis not present

## 2017-09-13 ENCOUNTER — Ambulatory Visit (INDEPENDENT_AMBULATORY_CARE_PROVIDER_SITE_OTHER): Payer: Medicare Other | Admitting: Physician Assistant

## 2017-09-13 ENCOUNTER — Encounter: Payer: Self-pay | Admitting: Physician Assistant

## 2017-09-13 VITALS — BP 114/67 | HR 60 | Wt 148.0 lb

## 2017-09-13 DIAGNOSIS — E1151 Type 2 diabetes mellitus with diabetic peripheral angiopathy without gangrene: Secondary | ICD-10-CM | POA: Diagnosis not present

## 2017-09-13 DIAGNOSIS — F39 Unspecified mood [affective] disorder: Secondary | ICD-10-CM

## 2017-09-13 DIAGNOSIS — R413 Other amnesia: Secondary | ICD-10-CM

## 2017-09-13 DIAGNOSIS — K219 Gastro-esophageal reflux disease without esophagitis: Secondary | ICD-10-CM

## 2017-09-13 DIAGNOSIS — IMO0002 Reserved for concepts with insufficient information to code with codable children: Secondary | ICD-10-CM

## 2017-09-13 DIAGNOSIS — I2510 Atherosclerotic heart disease of native coronary artery without angina pectoris: Secondary | ICD-10-CM | POA: Diagnosis not present

## 2017-09-13 DIAGNOSIS — E1165 Type 2 diabetes mellitus with hyperglycemia: Secondary | ICD-10-CM

## 2017-09-13 DIAGNOSIS — K2100 Gastro-esophageal reflux disease with esophagitis, without bleeding: Secondary | ICD-10-CM

## 2017-09-13 MED ORDER — ARIPIPRAZOLE 15 MG PO TABS: 15.0000 mg | ORAL_TABLET | Freq: Every day | ORAL | 5 refills | Status: DC

## 2017-09-13 MED ORDER — PANTOPRAZOLE SODIUM 20 MG PO TBEC: 20.0000 mg | DELAYED_RELEASE_TABLET | Freq: Every day | ORAL | 1 refills | Status: DC

## 2017-09-13 MED ORDER — SEMAGLUTIDE(0.25 OR 0.5MG/DOS) 2 MG/1.5ML ~~LOC~~ SOPN: 0.2500 mg | PEN_INJECTOR | SUBCUTANEOUS | 11 refills | Status: DC

## 2017-09-13 NOTE — Progress Notes (Addendum)
HPI:                                                                Mark Maynard. is a 62 y.o. male who presents to [name]: Primary Care Sports Medicine today for diabetes follow-up  Patient with PMH of DM2, HTN, CAD s/p multiple stents, hx of CABG, anxiety and depression presents today for diabetes follow-up. Last A1c was 6.8, 2 months ago. He was switched from Lantus to [place] 22 Units, 2 months ago. Admits he has not been compliant with his treatment plan. Does not check FBG's. Administers insulin arbitrarily based on what he eats, I.e. "if I have 2 slices of pizza I give myself 10 units."   Partner reports he is having memory difficulties and forgetting to take his medications. Patient reports getting lost while driving in a familiar area and he was pulled over by police. States EMS was contacted because he was disoriented and officer was concerned he was having a diabetic event.   He also tells me he was diagnosed with bipolar disorder years ago. Has been taking Prozac 40 mg for years. He has had worsening depressive symptoms including hopelessness, fatigue, hypersomnolence, trouble concentrating. Denies SI/HI. Denies AH/VH. Lives at home alone. Does not work or have any hobbies.    Depression screen [facility] 2/9 09/13/2017 10/23/2016 04/28/2016  Decreased Interest 3 0 0  Down, Depressed, Hopeless 3 0 0  PHQ - 2 Score 6 0 0  Altered sleeping 3 - 0  Tired, decreased energy 3 - 0  Change in appetite 3 - 0  Feeling bad or failure about yourself  3 - 0  Trouble concentrating 3 - 1  Moving slowly or fidgety/restless 3 - 0  Suicidal thoughts 0 - 0  PHQ-9 Score 24 - 1   GAD 7 : Generalized Anxiety Score 09/13/2017  Nervous, Anxious, on Edge 3  Control/stop worrying 1  Worry too much - different things 3  Trouble relaxing 1  Restless 2  Easily annoyed or irritable 0  Afraid - awful might happen 2  Total GAD 7 Score 12    Past Medical History:  Diagnosis Date   . Anxiety   . CAD (coronary artery disease)    native 3-vessel disease  . Depression   . Diabetes mellitus without complication (HCC)   . Hyperlipidemia   . Hypertension   . Myocardial infarction (HCC) 2012   x 3, s/p CABG, 9 stents   Past Surgical History:  Procedure Laterality Date  . CORONARY ARTERY BYPASS GRAFT  2012  . CORONARY ARTERY BYPASS GRAFT    . EYE SURGERY  1980's   ptyrgium removal  . PERCUTANEOUS CORONARY STENT INTERVENTION (PCI-S)  06/2017   Social History   Tobacco Use  . Smoking status: Never Smoker  . Smokeless tobacco: Never Used  Substance Use Topics  . Alcohol use: Yes    Alcohol/week: 0.6 oz    Types: 1 Cans of beer per week   family history includes Melanoma in his father.    ROS: negative except as noted in the HPI  Medications: Current Outpatient Medications  Medication Sig Dispense Refill  . AMBULATORY NON FORMULARY MEDICATION Single glucometer with lancets, test strips.   Check fasting morning  blood sugar QD. Please bill with insurance. 1 each 0  . ARIPiprazole (ABILIFY) 15 MG tablet Take 1 tablet (15 mg total) by mouth at bedtime. 30 tablet 5  . aspirin EC 81 MG tablet Take 1 tablet (81 mg total) by mouth daily. 90 tablet 3  . atenolol (TENORMIN) 25 MG tablet Take 1 tablet (25 mg total) by mouth daily. 90 tablet 0  . atorvastatin (LIPITOR) 80 MG tablet Take 1 tablet (80 mg total) by mouth daily. (Patient taking differently: Take 40 mg by mouth daily. ) 90 tablet 3  . clopidogrel (PLAVIX) 75 MG tablet Take 1 tablet (75 mg total) by mouth daily. 90 tablet 3  . Empagliflozin-metFORMIN HCl ER (SYNJARDY XR) 12.09-998 MG TB24 Take 2 tablets by mouth daily with breakfast. 180 tablet 1  . FLUoxetine (PROZAC) 40 MG capsule Take 1 capsule (40 mg total) by mouth daily. 90 capsule 1  . isosorbide mononitrate (IMDUR) 30 MG 24 hr tablet Take 1 tablet (30 mg total) by mouth daily. 90 tablet 3  . nitroGLYCERIN (NITROSTAT) 0.4 MG SL tablet Place 1 tablet  (0.4 mg total) under the tongue every 5 (five) minutes as needed for chest pain (or tightness). 30 tablet 0  . pantoprazole (PROTONIX) 20 MG tablet Take 1 tablet (20 mg total) by mouth daily. 90 tablet 1  . Semaglutide (OZEMPIC) 0.25 or 0.5 MG/DOSE SOPN Inject 0.25 mg into the skin once a week. 0.25 mg SQ once weekly for 4 weeks then increase to 0.5 mg once weekly 4 pen 11   No current facility-administered medications for this visit.    No Known Allergies     Objective:  BP 114/67   Pulse 60   Wt 148 lb (67.1 kg)   BMI 22.50 kg/m  Gen:  alert, not ill-appearing, no distress, appropriate for age HEENT: head normocephalic without obvious abnormality, conjunctiva and cornea clear, trachea midline Pulm: Normal work of breathing, normal phonation, clear to auscultation bilaterally, no wheezes, rales or rhonchi CV: Normal rate, regular rhythm, s1 and s2 distinct, no murmurs, clicks or rubs  Neuro: alert and oriented x 3, no tremor MSK: extremities atraumatic, normal gait and station Skin: intact, no rashes on exposed skin, no jaundice, no cyanosis Psych: appearance casual, cooperative, good eye contact, depressed mood, affect full range, appears agitated, speech is articulate, lacks insight  MMSE - Mini Mental State Exam 09/16/2017  Orientation to time 4  Orientation to Place 5  Registration 3  Attention/ Calculation 4  Recall 3  Language- name 2 objects 2  Language- repeat 1  Language- follow 3 step command 3  Language- read & follow direction 1  Write a sentence 1  Copy design 0  Total score 27    No results found for this or any previous visit (from the past 72 hour(s)). No results found.    Assessment and Plan: 62 y.o. male with  Mood disorder, Memory difficulties - MMSE 27, normal, suspect symptoms are either pseudo-dementia from uncontrolled mood disorder or hypoglycemic episodes due to medication noncompliance or a combination - PHQ9=24, severe, no acute safety  issues or psychosis. Starting Abilify 15 mg nightly, continue Fluoxetine, and referring to Psychiatry.   Diabetes Lab Results  Component Value Date   HGBA1C 6.8 07/16/2017  - stopping insulin due to noncompliance with monitoring glucose and possible hypoglycemia. switching to Ozempic weekly. Continue Synjardy  Mood disorder (HCC) - Plan: ARIPiprazole (ABILIFY) 15 MG tablet, Ambulatory referral to Psychiatry  Gastroesophageal reflux disease, esophagitis  presence not specified - Plan: pantoprazole (PROTONIX) 20 MG tablet  Uncontrolled diabetes mellitus type 2 with peripheral artery disease (HCC) - Plan: Semaglutide (OZEMPIC) 0.25 or 0.5 MG/DOSE SOPN, Ambulatory referral to diabetic education  Gastroesophageal reflux disease with esophagitis  Memory difficulties   Patient education and anticipatory guidance given Patient agrees with treatment plan Follow-up as needed if symptoms worsen or fail to improve  Levonne Hubert PA-C

## 2017-09-13 NOTE — Patient Instructions (Signed)
For your diabetes: - continue your Synjardy as you are taking it - stop your Tresiba/Insulin - start Ozempic injection once weekly - monitor your morning fasting blood sugar   Diabetes Preventive Care: - annual foot exam  - annual dilated eye exam with an eye doctor - self foot exams at least weekly - twice yearly dental cleanings and yearly exam - goal blood pressure <140/90, ideally <130/80 - LDL cholesterol <70 - A1C <1.6<7.0 - body mass index (BMI) <25.0 - follow-up every 3 months if your A1C is not at goal - follow-up every 6 months if diabetes is well controlled

## 2017-09-16 ENCOUNTER — Encounter: Payer: Self-pay | Admitting: Physician Assistant

## 2017-09-16 DIAGNOSIS — R413 Other amnesia: Secondary | ICD-10-CM | POA: Insufficient documentation

## 2017-09-16 DIAGNOSIS — F39 Unspecified mood [affective] disorder: Secondary | ICD-10-CM | POA: Insufficient documentation

## 2017-10-01 ENCOUNTER — Telehealth: Payer: Self-pay

## 2017-10-01 ENCOUNTER — Other Ambulatory Visit: Payer: Self-pay | Admitting: Physician Assistant

## 2017-10-01 DIAGNOSIS — F39 Unspecified mood [affective] disorder: Secondary | ICD-10-CM

## 2017-10-01 NOTE — Telephone Encounter (Signed)
Pt is requesting a new referral for behavior health.  He is wanting to go to [facility] behavioral health in [facility].  He is also wanting a referral to neuro due to his off gait.? Please advise. -EH/RMA

## 2017-10-01 NOTE — Telephone Encounter (Signed)
New referral placed to behavioral health I will evaluated him in the office and decide if he needs a neurology referral

## 2017-10-04 NOTE — Telephone Encounter (Signed)
Pt notified -EH/RMA  

## 2017-10-07 ENCOUNTER — Telehealth: Payer: Self-pay

## 2017-10-07 NOTE — Telephone Encounter (Signed)
Pt notified that Ozempic was approved and ready for pickup at [facility] pharmacy. -EH/RMA

## 2017-10-14 ENCOUNTER — Telehealth: Payer: Self-pay | Admitting: Physician Assistant

## 2017-10-14 NOTE — Telephone Encounter (Signed)
Application for disability parking placard completed and signed Patient filled in the physician's section, so a new form was completed and he will need to re-sign the new copy Left at front desk

## 2017-11-08 ENCOUNTER — Other Ambulatory Visit: Payer: Self-pay | Admitting: Physician Assistant

## 2017-11-08 ENCOUNTER — Ambulatory Visit (INDEPENDENT_AMBULATORY_CARE_PROVIDER_SITE_OTHER): Payer: Medicare Other | Admitting: Physician Assistant

## 2017-11-08 ENCOUNTER — Encounter: Payer: Self-pay | Admitting: Physician Assistant

## 2017-11-08 ENCOUNTER — Ambulatory Visit (INDEPENDENT_AMBULATORY_CARE_PROVIDER_SITE_OTHER): Payer: Medicare Other

## 2017-11-08 VITALS — BP 123/77 | HR 60 | Temp 98.4°F | Resp 14 | Wt 142.0 lb

## 2017-11-08 DIAGNOSIS — J181 Lobar pneumonia, unspecified organism: Secondary | ICD-10-CM

## 2017-11-08 DIAGNOSIS — R7989 Other specified abnormal findings of blood chemistry: Secondary | ICD-10-CM | POA: Diagnosis not present

## 2017-11-08 DIAGNOSIS — R042 Hemoptysis: Secondary | ICD-10-CM

## 2017-11-08 DIAGNOSIS — J189 Pneumonia, unspecified organism: Secondary | ICD-10-CM

## 2017-11-08 DIAGNOSIS — R911 Solitary pulmonary nodule: Secondary | ICD-10-CM

## 2017-11-08 DIAGNOSIS — I2510 Atherosclerotic heart disease of native coronary artery without angina pectoris: Secondary | ICD-10-CM

## 2017-11-08 DIAGNOSIS — R079 Chest pain, unspecified: Secondary | ICD-10-CM | POA: Diagnosis not present

## 2017-11-08 MED ORDER — PREDNISONE 50 MG PO TABS: 50.0000 mg | ORAL_TABLET | Freq: Every day | ORAL | 0 refills | Status: DC

## 2017-11-08 MED ORDER — AZITHROMYCIN 250 MG PO TABS: ORAL_TABLET | ORAL | 0 refills | Status: DC

## 2017-11-08 NOTE — Progress Notes (Signed)
HPI:                                                                Mark GriffonJoseph B Bertagnolli Jr. is a 62 y.o. male who [facility]: Primary Care Sports Medicine today for follow-up mood disorder/memory difficulties  At last office visit on 09/13/17 patient complained of trouble remembering to take his medications and one episode of disorientation. He had a normal MMSE of 27 and his PHQ9 was notable for severe major depression (PHQ9=24). At that time he revealed that he had been diagnosed with bipolar disorder in the past. He was started on Abilify 15 mg in addition to his Fluoxetine 40 mg and was referred to Psychiatry.   Patient complains of fatigue and blood tinged sputum for the last 2-3 days. Associated with 10 days of right-sided chest pain radiating to the right shoulder blade worse with bending forward, coughing and deep inspriation. Associated with decreased appetite, fatigue and weight loss. He had a cardiac cath on 07/12/17 showing severe 3 vessel CAD with normal LVEF  Depression screen Mark Maynard 2/9 09/13/2017 10/23/2016 04/28/2016  Decreased Interest 3 0 0  Down, Depressed, Hopeless 3 0 0  PHQ - 2 Score 6 0 0  Altered sleeping 3 - 0  Tired, decreased energy 3 - 0  Change in appetite 3 - 0  Feeling bad or failure about yourself  3 - 0  Trouble concentrating 3 - 1  Moving slowly or fidgety/restless 3 - 0  Suicidal thoughts 0 - 0  PHQ-9 Score 24 - 1    GAD 7 : Generalized Anxiety Score 09/13/2017  Nervous, Anxious, on Edge 3  Control/stop worrying 1  Worry too much - different things 3  Trouble relaxing 1  Restless 2  Easily annoyed or irritable 0  Afraid - awful might happen 2  Total GAD 7 Score 12      Past Medical History:  Diagnosis Date  . Anxiety   . CAD (coronary artery disease)    native 3-vessel disease  . Depression   . Diabetes mellitus without complication (HCC)   . Hyperlipidemia   . Hypertension   . Myocardial infarction (HCC) 2012   x 3, s/p CABG, 9 stents   Past Surgical History:  Procedure Laterality Date  . CORONARY ARTERY BYPASS GRAFT  2012  . CORONARY ARTERY BYPASS GRAFT    . EYE SURGERY  1980's   ptyrgium removal  . PERCUTANEOUS CORONARY STENT INTERVENTION (PCI-S)  06/2017   Social History   Tobacco Use  . Smoking status: Never Smoker  . Smokeless tobacco: Never Used  Substance Use Topics  . Alcohol use: Yes    Alcohol/week: 0.6 oz    Types: 1 Cans of beer per week   family history includes Melanoma in his father.    ROS: negative except as noted in the HPI  Medications: Current Outpatient Medications  Medication Sig Dispense Refill  . AMBULATORY NON FORMULARY MEDICATION Single glucometer with lancets, test strips.   Check fasting morning blood sugar QD. Please bill with insurance. 1 each 0  . ARIPiprazole (ABILIFY) 15 MG tablet Take 1 tablet (15 mg total) by mouth at bedtime. 30 tablet 5  . aspirin EC 81 MG tablet Take 1 tablet (  81 mg total) by mouth daily. 90 tablet 3  . atenolol (TENORMIN) 25 MG tablet Take 1 tablet (25 mg total) by mouth daily. 90 tablet 0  . atorvastatin (LIPITOR) 80 MG tablet Take 1 tablet (80 mg total) by mouth daily. (Patient taking differently: Take 40 mg by mouth daily. ) 90 tablet 3  . azithromycin (ZITHROMAX Z-PAK) 250 MG tablet Take 2 tablets (500 mg) on  Day 1,  followed by 1 tablet (250 mg) once daily on Days 2 through 5. 6 tablet 0  . clopidogrel (PLAVIX) 75 MG tablet Take 1 tablet (75 mg total) by mouth daily. 90 tablet 3  . Empagliflozin-metFORMIN HCl ER (SYNJARDY XR) 12.09-998 MG TB24 Take 2 tablets by mouth daily with breakfast. 180 tablet 1  . FLUoxetine (PROZAC) 40 MG capsule Take 1 capsule (40 mg total) by mouth daily. 90 capsule 1  . isosorbide mononitrate (IMDUR) 30 MG 24 hr tablet Take 1 tablet (30 mg total) by mouth daily. 90 tablet 3  . nitroGLYCERIN (NITROSTAT) 0.4 MG SL tablet Place 1 tablet (0.4 mg total) under the tongue every 5 (five) minutes as  needed for chest pain (or tightness). 30 tablet 0  . pantoprazole (PROTONIX) 20 MG tablet Take 1 tablet (20 mg total) by mouth daily. 90 tablet 1  . predniSONE (DELTASONE) 50 MG tablet Take 1 tablet (50 mg total) by mouth daily. 5 tablet 0  . Semaglutide (OZEMPIC) 0.25 or 0.5 MG/DOSE SOPN Inject 0.25 mg into the skin once a week. 0.25 mg SQ once weekly for 4 weeks then increase to 0.5 mg once weekly 4 pen 11   No current facility-administered medications for this visit.    No Known Allergies     Objective:  BP 123/77   Pulse 60   Temp 98.4 F (36.9 C)   Resp 14   Wt 142 lb (64.4 kg)   SpO2 97%   BMI 21.59 kg/m  Gen:  alert, ill-appearing, not toxic-appearing, no distress, appropriate for age HEENT: head normocephalic without obvious abnormality, conjunctiva and cornea clear, wearing glasses, oropharynx clear, moist mucous membranes, trachea midline Pulm: Normal work of breathing, normal phonation, clear to auscultation bilaterally, no wheezes, rales or rhonchi CV: Normal rate, regular rhythm, s1 and s2 distinct, no murmurs, clicks or rubs  Neuro: alert and oriented x 3, no tremor MSK: chest wall atraumatic, non-tender; extremities atraumatic, normal gait and station Skin: intact, no rashes on exposed skin, no jaundice, no cyanosis   No results found for this or any previous visit (from the past 72 hour(s)). Dg Chest 2 View  Result Date: 11/08/2017 CLINICAL DATA:  Chest pain with hemoptysis for 3 days. EXAM: CHEST - 2 VIEW COMPARISON:  None. FINDINGS: Focal subpleural opacity in the right mid chest. No edema, effusion, or pneumothorax. Status post median sternotomy for CABG. There is also coronary stenting. Normal heart size and aortic contours. These results will be called to the ordering clinician or representative as soon as possible by the Radiologist Assistant, and communication documented in the PACS or zVision Dashboard. IMPRESSION: Focal opacity in the subpleural right chest.  Pneumonia, mass, or infarct are considerations with this appearance and history. Consider chest CT. Electronically Signed   By: Marnee Spring M.D.   On: 11/08/2017 13:30      Assessment and Plan: 62 y.o. male with   Hemoptysis - Plan: DG Chest 2 View, CBC with Differential/Platelet, Comprehensive metabolic panel, D-dimer, quantitative (not at [facility]), Troponin T, CT Chest W Contrast,  CT ANGIO CHEST PE W OR WO CONTRAST  Chest pain, unspecified type - Plan: DG Chest 2 View, D-dimer, quantitative (not at [facility]), Troponin T, CT Chest W Contrast, CT ANGIO CHEST PE W OR WO CONTRAST  Coronary artery disease involving native heart without angina pectoris, unspecified vessel or lesion type  Pneumonia of right lower lobe due to infectious organism (HCC) - Plan: DISCONTINUED: predniSONE (DELTASONE) 50 MG tablet  Solitary pulmonary nodule - Plan: CT Chest W Contrast  Positive D dimer - Plan: CT ANGIO CHEST PE W OR WO CONTRAST  - afebrile, no tachypnea, no tachycardia, SPO2 97% on RA at rest - given new onset pleuritic chest pain, malaise and blood-tinged sputum will obtain CXR and treat empirically for CAP. PERC rule 2. D-dimer to r/o PE.   Patient education and anticipatory guidance given Patient agrees with treatment plan Follow-up in 1 week or sooner as needed if symptoms worsen or fail to improve  Levonne Hubert PA-C

## 2017-11-08 NOTE — Patient Instructions (Addendum)

## 2017-11-09 ENCOUNTER — Ambulatory Visit (HOSPITAL_BASED_OUTPATIENT_CLINIC_OR_DEPARTMENT_OTHER)
Admission: RE | Admit: 2017-11-09 | Discharge: 2017-11-09 | Disposition: A | Discharge status: Home / Self Care | Payer: Medicare Other | Source: Ambulatory Visit | Attending: Physician Assistant | Admitting: Physician Assistant

## 2017-11-09 ENCOUNTER — Encounter: Payer: Self-pay | Admitting: Physician Assistant

## 2017-11-09 ENCOUNTER — Ambulatory Visit (HOSPITAL_BASED_OUTPATIENT_CLINIC_OR_DEPARTMENT_OTHER): Admission: RE | Admit: 2017-11-09 | Payer: Medicare Other | Source: Ambulatory Visit

## 2017-11-09 DIAGNOSIS — R042 Hemoptysis: Secondary | ICD-10-CM | POA: Insufficient documentation

## 2017-11-09 DIAGNOSIS — K8020 Calculus of gallbladder without cholecystitis without obstruction: Secondary | ICD-10-CM | POA: Diagnosis not present

## 2017-11-09 DIAGNOSIS — J189 Pneumonia, unspecified organism: Secondary | ICD-10-CM

## 2017-11-09 DIAGNOSIS — R079 Chest pain, unspecified: Secondary | ICD-10-CM | POA: Diagnosis not present

## 2017-11-09 DIAGNOSIS — R918 Other nonspecific abnormal finding of lung field: Secondary | ICD-10-CM | POA: Diagnosis not present

## 2017-11-09 DIAGNOSIS — R7989 Other specified abnormal findings of blood chemistry: Secondary | ICD-10-CM | POA: Insufficient documentation

## 2017-11-09 DIAGNOSIS — R911 Solitary pulmonary nodule: Secondary | ICD-10-CM | POA: Insufficient documentation

## 2017-11-09 DIAGNOSIS — J181 Lobar pneumonia, unspecified organism: Secondary | ICD-10-CM

## 2017-11-09 DIAGNOSIS — J90 Pleural effusion, not elsewhere classified: Secondary | ICD-10-CM | POA: Diagnosis not present

## 2017-11-09 HISTORY — DX: Solitary pulmonary nodule: R91.1

## 2017-11-09 HISTORY — DX: Pneumonia, unspecified organism: J18.9

## 2017-11-09 MED ORDER — IOPAMIDOL (ISOVUE-370) INJECTION 76%
100.0000 mL | Freq: Once | INTRAVENOUS | Status: AC | PRN
  Administered 2017-11-09: 100 mL via INTRAVENOUS

## 2017-11-09 NOTE — Progress Notes (Signed)
Good news, there is no blood clot or mass in the lung There is a little bit of fluid on the lung and evidence of right middle lobe pneumonia Repeat CXR in 4 weeks to ensure resolution of pneumonia Incidental finding of a 3mm nodule in the the right upper lobe, does not require any follow-up imaging Keep follow-up appointment on the 24th to make sure pneumonia is responding to the antibiotic

## 2017-11-11 LAB — TROPONIN T: Troponin T TROPT: <0.01 ng/mL (ref <0.01)

## 2017-11-15 ENCOUNTER — Encounter: Payer: Self-pay | Admitting: Physician Assistant

## 2017-11-15 ENCOUNTER — Ambulatory Visit (INDEPENDENT_AMBULATORY_CARE_PROVIDER_SITE_OTHER): Payer: Medicare Other | Admitting: Physician Assistant

## 2017-11-15 VITALS — BP 98/62 | HR 81 | Temp 97.4°F | Wt 144.0 lb

## 2017-11-15 DIAGNOSIS — J189 Pneumonia, unspecified organism: Secondary | ICD-10-CM

## 2017-11-15 DIAGNOSIS — J181 Lobar pneumonia, unspecified organism: Secondary | ICD-10-CM

## 2017-11-15 DIAGNOSIS — I2510 Atherosclerotic heart disease of native coronary artery without angina pectoris: Secondary | ICD-10-CM

## 2017-11-15 DIAGNOSIS — Z8719 Personal history of other diseases of the digestive system: Secondary | ICD-10-CM

## 2017-11-15 DIAGNOSIS — Z9889 Other specified postprocedural states: Secondary | ICD-10-CM | POA: Diagnosis not present

## 2017-11-15 MED ORDER — LEVOFLOXACIN 750 MG PO TABS: 750.0000 mg | ORAL_TABLET | Freq: Every day | ORAL | 0 refills | Status: AC

## 2017-11-15 NOTE — Patient Instructions (Addendum)
-   start new antibiotic, 1 tablet daily for 5 days - contact Cardiology about your Atenolol - reduce your dose to 1/2 tablet daily - follow-up with your General Surgeon regarding your mesh ([facility])

## 2017-11-15 NOTE — Progress Notes (Signed)
HPI:                                                                Mark GriffonJoseph B Whelchel Jr. is a 62 y.o. male who [name] [facility] Medicine today for pneumonia follow-up  Pleasant 62 yo M with PMH of poorly controlled DM2, CAD s/p CABG, HTN presented with 10 days of right-sided chest pain and new onset blood tingued sputum. He was found to have a right-sided focal opacity on CXR. CT angio was negative for acute PE, supported diagnosis of RML pneumonia. He was treated with Azithromycin and Prednisone x 5 days. Presents today for follow-up. Continues to endorse pleuritic chest pain and lethargy. There has been no additional fever, hemoptysis or cough.  He also c/o of surgical mesh in his right inguinal region "poking" him and "feels like it has migrated." hx of right [place]  Past Medical History:  Diagnosis Date  . Anxiety   . CAD (coronary artery disease)    native 3-vessel disease  . Depression   . Diabetes mellitus without complication (HCC)   . Hyperlipidemia   . Hypertension   . Myocardial infarction (HCC) 2012   x 3, s/p CABG, 9 stents  . Right middle lobe pneumonia (HCC) 11/09/2017  . Right upper lobe pulmonary nodule 11/09/2017   Past Surgical History:  Procedure Laterality Date  . CORONARY ARTERY BYPASS GRAFT  2012  . CORONARY ARTERY BYPASS GRAFT    . EYE SURGERY  1980's   ptyrgium removal  . PERCUTANEOUS CORONARY STENT INTERVENTION (PCI-S)  06/2017   Social History   Tobacco Use  . Smoking status: Never Smoker  . Smokeless tobacco: Never Used  Substance Use Topics  . Alcohol use: Yes    Alcohol/week: 0.6 oz    Types: 1 Cans of beer per week   family history includes Melanoma in his father.    ROS: negative except as noted in the HPI  Medications: Current Outpatient Medications  Medication Sig Dispense Refill  . AMBULATORY NON FORMULARY MEDICATION Single glucometer with lancets,  test strips.   Check fasting morning blood sugar QD. Please bill with insurance. 1 each 0  . ARIPiprazole (ABILIFY) 15 MG tablet Take 1 tablet (15 mg total) by mouth at bedtime. 30 tablet 5  . aspirin EC 81 MG tablet Take 1 tablet (81 mg total) by mouth daily. 90 tablet 3  . atenolol (TENORMIN) 25 MG tablet Take 1 tablet (25 mg total) by mouth daily. 90 tablet 0  . atorvastatin (LIPITOR) 80 MG tablet Take 1 tablet (80 mg total) by mouth daily. (Patient taking differently: Take 40 mg by mouth daily. ) 90 tablet 3  . clopidogrel (PLAVIX) 75 MG tablet Take 1 tablet (75 mg total) by mouth daily. 90 tablet 3  . Empagliflozin-metFORMIN HCl ER (SYNJARDY XR) 12.09-998 MG TB24 Take 2 tablets by mouth daily with breakfast. 180 tablet 1  . FLUoxetine (PROZAC) 40 MG capsule Take 1 capsule (40 mg total) by mouth daily. 90 capsule 1  . isosorbide mononitrate (IMDUR) 30 MG 24 hr tablet Take 1 tablet (30 mg total) by mouth daily. 90 tablet 3  . nitroGLYCERIN (NITROSTAT) 0.4 MG SL tablet Place 1  tablet (0.4 mg total) under the tongue every 5 (five) minutes as needed for chest pain (or tightness). 30 tablet 0  . pantoprazole (PROTONIX) 20 MG tablet Take 1 tablet (20 mg total) by mouth daily. 90 tablet 1  . Semaglutide (OZEMPIC) 0.25 or 0.5 MG/DOSE SOPN Inject 0.25 mg into the skin once a week. 0.25 mg SQ once weekly for 4 weeks then increase to 0.5 mg once weekly 4 pen 11   No current facility-administered medications for this visit.    No Known Allergies     Objective:  BP 98/62   Pulse 81   Temp (!) 97.4 F (36.3 C) (Oral)   Wt 144 lb (65.3 kg)   SpO2 98%   BMI 21.90 kg/m  Gen:  alert, not ill-appearing, no distress, appropriate for age HEENT: head normocephalic without obvious abnormality, conjunctiva and cornea clear, trachea midline Pulm: Normal work of breathing, normal phonation, clear to auscultation bilaterally, no wheezes, rales or rhonchi CV: Normal rate, regular rhythm, s1 and s2  distinct, no murmurs, clicks or rubs  Neuro: alert and oriented x 3, no tremor MSK: extremities atraumatic, normal gait and station Skin: intact, no rashes on exposed skin, no jaundice, no cyanosis   No results found for this or any previous visit (from the past 72 hour(s)). No results found.    Assessment and Plan: 62 y.o. male with   Pneumonia of right middle lobe due to infectious organism (HCC) - Plan: levofloxacin (LEVAQUIN) 750 MG tablet  H/O right inguinal hernia repair - Plan: Ambulatory referral to General Surgery  CAP of RML - pneumonia has improved clinically. Afebrile, no tachypnea, no tachycardia, SpO298% on RA rest. However, patient has significant co-morbidities and symptoms have plateued and he is still endorsing fatigue, malaise and pleuritic chest pain. Will broaden antibiotic coverage with Levaquin.  Repeat CXR in 4 weeks.  H/o R inguinal hernia repair - original surgery performed out of state in [place]. Patient is concerned about his mesh migrating and is having pain and palpable abnormality in the area of his hernia repair. Referring to Gen Surg  Patient education and anticipatory guidance given Patient agrees with treatment plan Follow-up as needed if symptoms worsen or fail to improve  Levonne Hubert PA-C

## 2017-11-17 ENCOUNTER — Telehealth (HOSPITAL_COMMUNITY): Payer: Self-pay | Admitting: Physician Assistant

## 2017-11-19 ENCOUNTER — Encounter: Payer: Self-pay | Admitting: Physician Assistant

## 2017-11-19 ENCOUNTER — Ambulatory Visit (INDEPENDENT_AMBULATORY_CARE_PROVIDER_SITE_OTHER): Payer: Medicare Other | Admitting: Physician Assistant

## 2017-11-19 VITALS — BP 144/86 | HR 93 | Temp 97.8°F | Wt 141.0 lb

## 2017-11-19 DIAGNOSIS — IMO0002 Reserved for concepts with insufficient information to code with codable children: Secondary | ICD-10-CM

## 2017-11-19 DIAGNOSIS — I2510 Atherosclerotic heart disease of native coronary artery without angina pectoris: Secondary | ICD-10-CM | POA: Diagnosis not present

## 2017-11-19 DIAGNOSIS — R112 Nausea with vomiting, unspecified: Secondary | ICD-10-CM

## 2017-11-19 DIAGNOSIS — J189 Pneumonia, unspecified organism: Secondary | ICD-10-CM

## 2017-11-19 DIAGNOSIS — J181 Lobar pneumonia, unspecified organism: Secondary | ICD-10-CM | POA: Diagnosis not present

## 2017-11-19 DIAGNOSIS — E1151 Type 2 diabetes mellitus with diabetic peripheral angiopathy without gangrene: Secondary | ICD-10-CM

## 2017-11-19 DIAGNOSIS — E1165 Type 2 diabetes mellitus with hyperglycemia: Secondary | ICD-10-CM | POA: Diagnosis not present

## 2017-11-19 DIAGNOSIS — F39 Unspecified mood [affective] disorder: Secondary | ICD-10-CM

## 2017-11-19 MED ORDER — SEMAGLUTIDE(0.25 OR 0.5MG/DOS) 2 MG/1.5ML ~~LOC~~ SOPN: 0.2500 mg | PEN_INJECTOR | SUBCUTANEOUS | 11 refills | Status: DC

## 2017-11-19 MED ORDER — FLUOXETINE HCL 40 MG PO CAPS: 40.0000 mg | ORAL_CAPSULE | Freq: Every day | ORAL | 0 refills | Status: DC

## 2017-11-19 NOTE — Progress Notes (Signed)
HPI:                                                                Mark Maynard. is a 62 y.o. male who presents to [name]: Primary Care Sports Medicine today for pneumonia follow-up  Pleasant 62 yo M with PMH of poorly controlled DM2, CAD s/p CABG, HTN presented with 10 days of right-sided chest pain and new onset blood tingued sputum on 11/08/17. He was found to have a right-sided focal opacity on CXR. CT angio was negative for acute PE, supported diagnosis of RML pneumonia. He was treated with Azithromycin and Prednisone x 5 days, but continued to endorse malaise, fatigue, and pleuritic chest pain after treatment. He was started on Levaquin on 11/15/17.   Overall, improved since starting Levaquin on Monday. States he had 2 great days at home. Pleuritic chest pain resolved, no fevers or diaphoresis, no cough.  Reports single episode of emesis this morning and general malaise/decreased appetite. He vomited all of his medications today. He is tolerating PO and has been drinking water and toast.  Continues to have difficulty taking his medications as prescribed. He attributes this to "laziness." Med Recs have been performed with him at every office visit and detailed written instructions have been provided on his AVS. His girlfriend tries to help with medication management, but they do not live together.  Also requesting a refill of his Fluoxetine. He was referred to [facility] 8 weeks ago for depressed mood, cognitive difficulties and self-reported history of bipolar disorder. He then requested a new referral to [facility] in [place] on 10/01/17. He never scheduled a follow-up appointment. He is currently taking Abilify 15 mg every morning (prescribed QHS) as well as Fluoxetine 40 mg daily. He is disabled, lives at home alone, performs his own ADL's, no hobbies/exercise/or regular activities.   Past Medical History:  Diagnosis Date  . Anxiety   .  CAD (coronary artery disease)    native 3-vessel disease  . Depression   . Diabetes mellitus without complication (HCC)   . Hyperlipidemia   . Hypertension   . Myocardial infarction (HCC) 2012   x 3, s/p CABG, 9 stents  . Right middle lobe pneumonia (HCC) 11/09/2017  . Right upper lobe pulmonary nodule 11/09/2017   Past Surgical History:  Procedure Laterality Date  . CORONARY ARTERY BYPASS GRAFT  2012  . CORONARY ARTERY BYPASS GRAFT    . EYE SURGERY  1980's   ptyrgium removal  . PERCUTANEOUS CORONARY STENT INTERVENTION (PCI-S)  06/2017   Social History   Tobacco Use  . Smoking status: Never Smoker  . Smokeless tobacco: Never Used  Substance Use Topics  . Alcohol use: Yes    Alcohol/week: 0.6 oz    Types: 1 Cans of beer per week   family history includes Melanoma in his father.    ROS: negative except as noted in the HPI  Medications: Current Outpatient Medications  Medication Sig Dispense Refill  . AMBULATORY NON FORMULARY MEDICATION Single glucometer with lancets, test strips.   Check fasting morning blood sugar QD. Please bill with insurance. 1 each 0  . ARIPiprazole (ABILIFY) 15 MG tablet Take 1 tablet (15 mg total) by mouth at bedtime. 30 tablet  5  . aspirin EC 81 MG tablet Take 1 tablet (81 mg total) by mouth daily. 90 tablet 3  . atenolol (TENORMIN) 25 MG tablet Take 1 tablet (25 mg total) by mouth daily. 90 tablet 0  . atorvastatin (LIPITOR) 80 MG tablet Take 1 tablet (80 mg total) by mouth daily. (Patient taking differently: Take 40 mg by mouth daily. ) 90 tablet 3  . clopidogrel (PLAVIX) 75 MG tablet Take 1 tablet (75 mg total) by mouth daily. 90 tablet 3  . Empagliflozin-metFORMIN HCl ER (SYNJARDY XR) 12.09-998 MG TB24 Take 2 tablets by mouth daily with breakfast. 180 tablet 1  . FLUoxetine (PROZAC) 40 MG capsule Take 1 capsule (40 mg total) by mouth daily. 90 capsule 1  . isosorbide mononitrate (IMDUR) 30 MG 24 hr tablet Take 1 tablet (30 mg total) by mouth  daily. 90 tablet 3  . levofloxacin (LEVAQUIN) 750 MG tablet Take 1 tablet (750 mg total) by mouth daily for 5 days. 5 tablet 0  . nitroGLYCERIN (NITROSTAT) 0.4 MG SL tablet Place 1 tablet (0.4 mg total) under the tongue every 5 (five) minutes as needed for chest pain (or tightness). 30 tablet 0  . pantoprazole (PROTONIX) 20 MG tablet Take 1 tablet (20 mg total) by mouth daily. 90 tablet 1  . Semaglutide (OZEMPIC) 0.25 or 0.5 MG/DOSE SOPN Inject 0.25 mg into the skin once a week. 0.25 mg SQ once weekly for 4 weeks then increase to 0.5 mg once weekly 4 pen 11   No current facility-administered medications for this visit.    No Known Allergies     Objective:  BP (!) 144/86   Pulse 93   Temp 97.8 F (36.6 C) (Oral)   Wt 141 lb (64 kg)   SpO2 97%   BMI 21.44 kg/m  Gen:  alert, not ill-appearing, no distress, appropriate for age HEENT: head normocephalic without obvious abnormality, conjunctiva and cornea clear, wearing glasses, trachea midline Pulm: Normal work of breathing, normal phonation, clear to auscultation bilaterally, no wheezes, rales or rhonchi CV: Normal rate, regular rhythm, s1 and s2 distinct, no murmurs, clicks or rubs  Neuro: alert and oriented x 3, no tremor MSK: extremities atraumatic, normal gait and station Skin: intact, no rashes on exposed skin, no jaundice, no cyanosis Psych: appearance casual, cooperative, good eye contact, affect full range, speech is articulate, and thought processes clear and goal-directed    No results found for this or any previous visit (from the past 72 hour(s)). No results found.    Assessment and Plan: 62 y.o. male with   Pneumonia of right middle lobe due to infectious organism [facility])  Uncontrolled diabetes mellitus type 2 with peripheral artery disease (HCC) - Plan: Semaglutide (OZEMPIC) 0.25 or 0.5 MG/DOSE SOPN  Mood disorder (HCC) - Plan: FLUoxetine (PROZAC) 40 MG capsule  Non-intractable vomiting with nausea, unspecified  vomiting type  Community Acquired Pneumonia - treated with Azithromycin/Prednisone x 5 days, then Levaquin x 5 days - afebrile, no tachypnea, no tachycardia, SpO2 97% on RA at rest, no adventitious lung sounds. Clinically improving. 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 Gilmore LarocheAkhtar. Agree to refill medication until he can be seen by Psychiatry   Patient education and anticipatory guidance given Patient agrees with treatment plan Follow-up in 2 weeks for CXR or sooner as needed if symptoms worsen or fail to improve  Levonne Hubertharley E. Brevyn Ring PA-C

## 2017-11-19 NOTE — Patient Instructions (Addendum)
Go Downstairs today to Behavioral Health to set up appointment with Dr. Alta CorningAhktar for mood disorder and memory difficulties

## 2017-11-21 ENCOUNTER — Encounter: Payer: Self-pay | Admitting: Physician Assistant

## 2017-11-21 DIAGNOSIS — R7989 Other specified abnormal findings of blood chemistry: Secondary | ICD-10-CM | POA: Insufficient documentation

## 2017-11-24 DIAGNOSIS — R112 Nausea with vomiting, unspecified: Secondary | ICD-10-CM | POA: Diagnosis not present

## 2017-11-24 DIAGNOSIS — J189 Pneumonia, unspecified organism: Secondary | ICD-10-CM | POA: Diagnosis not present

## 2017-11-24 DIAGNOSIS — K8050 Calculus of bile duct without cholangitis or cholecystitis without obstruction: Secondary | ICD-10-CM | POA: Diagnosis not present

## 2017-11-24 DIAGNOSIS — F39 Unspecified mood [affective] disorder: Secondary | ICD-10-CM | POA: Diagnosis present

## 2017-11-24 DIAGNOSIS — K8070 Calculus of gallbladder and bile duct without cholecystitis without obstruction: Secondary | ICD-10-CM | POA: Diagnosis not present

## 2017-11-24 DIAGNOSIS — R918 Other nonspecific abnormal finding of lung field: Secondary | ICD-10-CM | POA: Diagnosis not present

## 2017-11-24 DIAGNOSIS — I10 Essential (primary) hypertension: Secondary | ICD-10-CM | POA: Diagnosis not present

## 2017-11-24 DIAGNOSIS — Z79899 Other long term (current) drug therapy: Secondary | ICD-10-CM | POA: Diagnosis not present

## 2017-11-24 DIAGNOSIS — E119 Type 2 diabetes mellitus without complications: Secondary | ICD-10-CM | POA: Diagnosis not present

## 2017-11-24 DIAGNOSIS — Z951 Presence of aortocoronary bypass graft: Secondary | ICD-10-CM | POA: Diagnosis not present

## 2017-11-24 DIAGNOSIS — I2510 Atherosclerotic heart disease of native coronary artery without angina pectoris: Secondary | ICD-10-CM | POA: Diagnosis present

## 2017-11-24 DIAGNOSIS — I25708 Atherosclerosis of coronary artery bypass graft(s), unspecified, with other forms of angina pectoris: Secondary | ICD-10-CM | POA: Diagnosis not present

## 2017-11-24 DIAGNOSIS — I25810 Atherosclerosis of coronary artery bypass graft(s) without angina pectoris: Secondary | ICD-10-CM | POA: Diagnosis not present

## 2017-11-24 DIAGNOSIS — R932 Abnormal findings on diagnostic imaging of liver and biliary tract: Secondary | ICD-10-CM | POA: Diagnosis not present

## 2017-11-24 DIAGNOSIS — Z23 Encounter for immunization: Secondary | ICD-10-CM | POA: Diagnosis not present

## 2017-11-24 DIAGNOSIS — R911 Solitary pulmonary nodule: Secondary | ICD-10-CM | POA: Diagnosis present

## 2017-11-24 DIAGNOSIS — K8080 Other cholelithiasis without obstruction: Secondary | ICD-10-CM | POA: Diagnosis present

## 2017-11-24 DIAGNOSIS — R109 Unspecified abdominal pain: Secondary | ICD-10-CM | POA: Diagnosis not present

## 2017-11-24 DIAGNOSIS — K8020 Calculus of gallbladder without cholecystitis without obstruction: Secondary | ICD-10-CM | POA: Diagnosis not present

## 2017-11-24 DIAGNOSIS — E1110 Type 2 diabetes mellitus with ketoacidosis without coma: Secondary | ICD-10-CM | POA: Diagnosis not present

## 2017-11-24 DIAGNOSIS — Z7982 Long term (current) use of aspirin: Secondary | ICD-10-CM | POA: Diagnosis not present

## 2017-11-24 DIAGNOSIS — K449 Diaphragmatic hernia without obstruction or gangrene: Secondary | ICD-10-CM | POA: Diagnosis not present

## 2017-11-24 DIAGNOSIS — J8410 Pulmonary fibrosis, unspecified: Secondary | ICD-10-CM | POA: Diagnosis not present

## 2017-11-26 DIAGNOSIS — R109 Unspecified abdominal pain: Secondary | ICD-10-CM | POA: Diagnosis not present

## 2017-11-26 DIAGNOSIS — E119 Type 2 diabetes mellitus without complications: Secondary | ICD-10-CM | POA: Diagnosis not present

## 2017-11-26 DIAGNOSIS — K8020 Calculus of gallbladder without cholecystitis without obstruction: Secondary | ICD-10-CM | POA: Diagnosis not present

## 2017-11-26 DIAGNOSIS — I10 Essential (primary) hypertension: Secondary | ICD-10-CM | POA: Diagnosis not present

## 2017-11-26 DIAGNOSIS — I25708 Atherosclerosis of coronary artery bypass graft(s), unspecified, with other forms of angina pectoris: Secondary | ICD-10-CM | POA: Diagnosis not present

## 2017-11-28 MED ORDER — ASPIRIN 81 MG PO CHEW
81.00 | CHEWABLE_TABLET | ORAL | Status: DC
Start: 2017-11-27 — End: 2017-11-28

## 2017-11-28 MED ORDER — HEPARIN SODIUM (PORCINE) 5000 UNIT/ML IJ SOLN
5000.00 | INTRAMUSCULAR | Status: DC
Start: 2017-11-26 — End: 2017-11-28

## 2017-11-28 MED ORDER — ISOSORBIDE MONONITRATE ER 30 MG PO TB24
30.00 | ORAL_TABLET | ORAL | Status: DC
Start: 2017-11-27 — End: 2017-11-28

## 2017-11-28 MED ORDER — GENERIC EXTERNAL MEDICATION
Status: DC
Start: ? — End: 2017-11-28

## 2017-11-28 MED ORDER — PANTOPRAZOLE SODIUM 40 MG PO TBEC
40.00 | DELAYED_RELEASE_TABLET | ORAL | Status: DC
Start: 2017-11-27 — End: 2017-11-28

## 2017-11-28 MED ORDER — NITROGLYCERIN 0.4 MG SL SUBL
0.40 | SUBLINGUAL_TABLET | SUBLINGUAL | Status: DC
Start: ? — End: 2017-11-28

## 2017-11-28 MED ORDER — ATORVASTATIN CALCIUM 40 MG PO TABS
40.00 | ORAL_TABLET | ORAL | Status: DC
Start: 2017-11-26 — End: 2017-11-28

## 2017-11-28 MED ORDER — INSULIN GLARGINE 100 UNIT/ML ~~LOC~~ SOLN
1.00 | SUBCUTANEOUS | Status: DC
Start: ? — End: 2017-11-28

## 2017-11-28 MED ORDER — CLOPIDOGREL BISULFATE 75 MG PO TABS
75.00 | ORAL_TABLET | ORAL | Status: DC
Start: 2017-11-27 — End: 2017-11-28

## 2017-11-28 MED ORDER — MORPHINE SULFATE (PF) 4 MG/ML IV SOLN
2.00 | INTRAVENOUS | Status: DC
Start: ? — End: 2017-11-28

## 2017-11-28 MED ORDER — ACETAMINOPHEN 325 MG PO TABS
650.00 | ORAL_TABLET | ORAL | Status: DC
Start: ? — End: 2017-11-28

## 2017-11-28 MED ORDER — ATENOLOL 25 MG PO TABS
12.50 | ORAL_TABLET | ORAL | Status: DC
Start: 2017-11-27 — End: 2017-11-28

## 2017-11-28 MED ORDER — INSULIN LISPRO 100 UNIT/ML ~~LOC~~ SOLN
1.00 | SUBCUTANEOUS | Status: DC
Start: ? — End: 2017-11-28

## 2017-11-28 MED ORDER — INSULIN LISPRO 100 UNIT/ML ~~LOC~~ SOLN
1.00 | SUBCUTANEOUS | Status: DC
Start: 2017-11-26 — End: 2017-11-28

## 2017-11-28 MED ORDER — ALUM & MAG HYDROXIDE-SIMETH 200-200-20 MG/5ML PO SUSP
30.00 | ORAL | Status: DC
Start: ? — End: 2017-11-28

## 2017-11-28 MED ORDER — ONDANSETRON HCL 4 MG/2ML IJ SOLN
4.00 | INTRAMUSCULAR | Status: DC
Start: ? — End: 2017-11-28

## 2017-11-28 MED ORDER — INSULIN GLARGINE 100 UNIT/ML ~~LOC~~ SOLN
1.00 | SUBCUTANEOUS | Status: DC
Start: 2017-11-26 — End: 2017-11-28

## 2017-11-28 MED ORDER — FLUOXETINE HCL 20 MG PO CAPS
20.00 | ORAL_CAPSULE | ORAL | Status: DC
Start: 2017-11-27 — End: 2017-11-28

## 2017-12-15 DIAGNOSIS — K8020 Calculus of gallbladder without cholecystitis without obstruction: Secondary | ICD-10-CM | POA: Diagnosis not present

## 2017-12-15 DIAGNOSIS — I10 Essential (primary) hypertension: Secondary | ICD-10-CM | POA: Diagnosis not present

## 2017-12-15 DIAGNOSIS — K8050 Calculus of bile duct without cholangitis or cholecystitis without obstruction: Secondary | ICD-10-CM | POA: Diagnosis not present

## 2017-12-16 DIAGNOSIS — I10 Essential (primary) hypertension: Secondary | ICD-10-CM | POA: Diagnosis not present

## 2017-12-16 DIAGNOSIS — K8050 Calculus of bile duct without cholangitis or cholecystitis without obstruction: Secondary | ICD-10-CM | POA: Insufficient documentation

## 2017-12-23 ENCOUNTER — Ambulatory Visit (HOSPITAL_COMMUNITY): Payer: Medicare Other | Admitting: Psychiatry

## 2017-12-27 ENCOUNTER — Encounter (HOSPITAL_COMMUNITY): Payer: Self-pay | Admitting: Psychiatry

## 2017-12-27 ENCOUNTER — Other Ambulatory Visit: Payer: Self-pay

## 2017-12-27 ENCOUNTER — Ambulatory Visit (INDEPENDENT_AMBULATORY_CARE_PROVIDER_SITE_OTHER): Payer: Medicare Other | Admitting: Psychiatry

## 2017-12-27 VITALS — BP 116/62 | HR 92 | Ht 68.0 in | Wt 144.0 lb

## 2017-12-27 DIAGNOSIS — F32A Depression, unspecified: Secondary | ICD-10-CM

## 2017-12-27 DIAGNOSIS — F419 Anxiety disorder, unspecified: Secondary | ICD-10-CM

## 2017-12-27 DIAGNOSIS — F319 Bipolar disorder, unspecified: Secondary | ICD-10-CM

## 2017-12-27 DIAGNOSIS — F39 Unspecified mood [affective] disorder: Secondary | ICD-10-CM | POA: Diagnosis not present

## 2017-12-27 DIAGNOSIS — F329 Major depressive disorder, single episode, unspecified: Secondary | ICD-10-CM | POA: Diagnosis not present

## 2017-12-27 MED ORDER — FLUOXETINE HCL 10 MG PO CAPS: 20.0000 mg | ORAL_CAPSULE | Freq: Every day | ORAL | 0 refills | Status: DC
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
## 2017-12-29 ENCOUNTER — Ambulatory Visit (INDEPENDENT_AMBULATORY_CARE_PROVIDER_SITE_OTHER): Payer: Medicare Other

## 2017-12-29 ENCOUNTER — Encounter: Payer: Self-pay | Admitting: Physician Assistant

## 2017-12-29 ENCOUNTER — Ambulatory Visit (INDEPENDENT_AMBULATORY_CARE_PROVIDER_SITE_OTHER): Payer: Medicare Other | Admitting: Physician Assistant

## 2017-12-29 VITALS — BP 116/73 | HR 83 | Wt 146.0 lb

## 2017-12-29 DIAGNOSIS — E1165 Type 2 diabetes mellitus with hyperglycemia: Secondary | ICD-10-CM

## 2017-12-29 DIAGNOSIS — J181 Lobar pneumonia, unspecified organism: Secondary | ICD-10-CM

## 2017-12-29 DIAGNOSIS — Z23 Encounter for immunization: Secondary | ICD-10-CM

## 2017-12-29 DIAGNOSIS — J189 Pneumonia, unspecified organism: Secondary | ICD-10-CM | POA: Diagnosis not present

## 2017-12-29 DIAGNOSIS — I2510 Atherosclerotic heart disease of native coronary artery without angina pectoris: Secondary | ICD-10-CM

## 2017-12-29 DIAGNOSIS — R911 Solitary pulmonary nodule: Secondary | ICD-10-CM | POA: Diagnosis not present

## 2017-12-29 DIAGNOSIS — Z8701 Personal history of pneumonia (recurrent): Secondary | ICD-10-CM | POA: Insufficient documentation

## 2017-12-29 LAB — POCT GLYCOSYLATED HEMOGLOBIN (HGB A1C): HBA1C, POC (CONTROLLED DIABETIC RANGE): 7 % (ref 0.0–7.0)

## 2017-12-29 MED ORDER — ATENOLOL 25 MG PO TABS: 12.5000 mg | ORAL_TABLET | Freq: Every day | ORAL | Status: DC

## 2017-12-29 MED ORDER — EMPAGLIFLOZIN 10 MG PO TABS: 10.0000 mg | ORAL_TABLET | Freq: Every day | ORAL | 1 refills | Status: DC

## 2017-12-29 NOTE — Patient Instructions (Addendum)
Chest x-ray today to assess for resolution of Pneumonia You do not need a CT scan as of right now  For your Diabetes: - continue your Ozempic as you are taking it - start Jardiance 10 mg in the morning - this will help with preventing future heart attacks as well as your blood sugar - continue to monitor your morning fasting blood sugar   Diabetes Preventive Care: - annual foot exam  - annual dilated eye exam with an eye doctor - self foot exams at least weekly - pneumonia vaccine once (booster in 5 years and at age 62) - annual influenza vaccine - twice yearly dental cleanings and yearly exam - goal blood pressure <140/90, ideally <130/80 - LDL cholesterol <70 - A1C <9.8<7.0 - body mass index (BMI) <25.0 - follow-up every 3 months if your A1C is not at goal - follow-up every 6 months if diabetes is well controlled

## 2017-12-29 NOTE — Progress Notes (Signed)
HPI:                                                                Mark GriffonJoseph B Campise Jr. is a 62 y.o. male [name] today for pneumonia follow-up  Diagnosed with RML CAP on 11/08/17. A CT angio showed a small 3mm RUL nodule that did not require follow-up imaging. He was treated with both Azithromycin followed by Levaquin after only partial clinical improvement. After completing Levaquin first week of July reports complete resolution of symptoms. Denies any pulmonary symptoms today. He is a nonsmoker, no prior history of lung disease.  Reports he is doing better with remembering to take his medications, with the help of his partner, Angie.  They state Synjardy was discontinued at last hospital admission due to hypoglycemia, so he is currently taking ozempic only. Reports FBG's in the 140's.  He was evaluated by Psychiatry for mood disorder c/f bipolar disorder. Abilify was continued and Fluoxetine 10 mg re-started. Patient would like me to continue these medications.  Additionally, he was evaluated by GI in July for gallstones. He has an ERCP and Colonoscopy planned for later this month.  [facility] 12/27/2017 09/13/2017 10/23/2016 04/28/2016  Decreased Interest 1 3 0 0  Down, Depressed, Hopeless 1 3 0 0  PHQ - 2 Score 2 6 0 0  Altered sleeping 1 3 - 0  Tired, decreased energy 2 3 - 0  Change in appetite 2 3 - 0  Feeling bad or failure about yourself  3 3 - 0  Trouble concentrating 3 3 - 1  Moving slowly or fidgety/restless 1 3 - 0  Suicidal thoughts 0 0 - 0  PHQ-9 Score 14 24 - 1    GAD 7 : Generalized Anxiety Score 12/27/2017 09/13/2017  Nervous, Anxious, on Edge 3 3  Control/stop worrying 2 1  Worry too much - different things 1 3  Trouble relaxing 3 1  Restless 3 2  Easily annoyed or irritable 1 0  Afraid - awful might happen 3 2  Total GAD 7 Score 16 12  Anxiety Difficulty Somewhat difficult -       Past Medical History:  Diagnosis Date  . Anxiety   . CAD (coronary artery disease)    native 3-vessel disease  . Choledocholithiasis   . Depression   . Diabetes mellitus without complication (HCC)   . Hyperlipidemia   . Hypertension   . Myocardial infarction (HCC) 2012   x 3, s/p CABG, 9 stents  . Right middle lobe pneumonia (HCC) 11/09/2017  . Right upper lobe pulmonary nodule 11/09/2017   Past Surgical History:  Procedure Laterality Date  . CORONARY ARTERY BYPASS GRAFT  2012  . CORONARY ARTERY BYPASS GRAFT    . EYE SURGERY  1980's   ptyrgium removal  . PERCUTANEOUS CORONARY STENT INTERVENTION (PCI-S)  06/2017   Social History   Tobacco Use  . Smoking status: Never Smoker  . Smokeless tobacco: Never Used  Substance Use Topics  . Alcohol use: Yes    Alcohol/week: 0.6 oz    Types: 1 Cans of beer per week   family history includes Melanoma in his father.    ROS: negative except as noted  in the HPI  Medications: Current Outpatient Medications  Medication Sig Dispense Refill  . AMBULATORY NON FORMULARY MEDICATION Single glucometer with lancets, test strips.   Check fasting morning blood sugar QD. Please bill with insurance. 1 each 0  . ARIPiprazole (ABILIFY) 15 MG tablet Take 1 tablet (15 mg total) by mouth at bedtime. 30 tablet 5  . aspirin EC 81 MG tablet Take 1 tablet (81 mg total) by mouth daily. 90 tablet 3  . atenolol (TENORMIN) 25 MG tablet Take 0.5 tablets (12.5 mg total) by mouth daily.    Marland Kitchen atorvastatin (LIPITOR) 80 MG tablet Take 1 tablet (80 mg total) by mouth daily. (Patient taking differently: Take 40 mg by mouth daily. ) 90 tablet 3  . clopidogrel (PLAVIX) 75 MG tablet Take 1 tablet (75 mg total) by mouth daily. 90 tablet 3  . empagliflozin (JARDIANCE) 10 MG TABS tablet Take 10 mg by mouth daily. 90 tablet 1  . FLUoxetine (PROZAC) 10 MG capsule Take 2 capsules (20 mg total) by mouth daily. Start with one capsule a day for 5 days and then 2 a day  60 capsule 0  . isosorbide mononitrate (IMDUR) 30 MG 24 hr tablet Take 1 tablet (30 mg total) by mouth daily. 90 tablet 3  . Multiple Vitamin (MULTIVITAMIN) tablet Take by mouth.    . nitroGLYCERIN (NITROSTAT) 0.4 MG SL tablet Place 1 tablet (0.4 mg total) under the tongue every 5 (five) minutes as needed for chest pain (or tightness). 30 tablet 0  . pantoprazole (PROTONIX) 20 MG tablet Take 1 tablet (20 mg total) by mouth daily. 90 tablet 1  . Semaglutide (OZEMPIC) 0.25 or 0.5 MG/DOSE SOPN Inject 0.25 mg into the skin every Saturday at 6 PM. 0.25 mg SQ once weekly for 4 weeks then increase to 0.5 mg once weekly 4 pen 11   No current facility-administered medications for this visit.    No Known Allergies     Objective:  BP 116/73   Pulse 83   Wt 146 lb (66.2 kg)   BMI 22.20 kg/m  Gen:  alert, not ill-appearing, no distress, appropriate for age HEENT: head normocephalic without obvious abnormality, conjunctiva and cornea clear, wearing glasses, trachea midline Pulm: Normal work of breathing, normal phonation, clear to auscultation bilaterally, no wheezes, rales or rhonchi CV: Normal rate, regular rhythm, s1 and s2 distinct, no murmurs, clicks or rubs  Neuro: alert and oriented x 3, no tremor MSK: extremities atraumatic, normal gait and station Skin: intact, no rashes on exposed skin, no jaundice, no cyanosis Psych: well-groomed, cooperative, good eye contact, euthymic mood, affect mood-congruent, speech is articulate, and thought processes clear and goal-directed  Diabetic Foot Exam - Simple   Simple Foot Form Diabetic Foot exam was performed with the following findings:  Yes 12/29/2017 11:29 AM  Visual Inspection No deformities, no ulcerations, no other skin breakdown bilaterally:  Yes Sensation Testing Intact to touch and monofilament testing bilaterally:  Yes Pulse Check Posterior Tibialis and Dorsalis pulse intact bilaterally:  Yes Comments      Results for orders placed  or performed in visit on 12/29/17 (from the past 72 hour(s))  POCT HgB A1C     Status: None   Collection Time: 12/29/17 11:29 AM  Result Value Ref Range   Hemoglobin A1C  4.0 - 5.6 %   HbA1c POC (<> result, manual entry)  4.0 - 5.6 %   HbA1c, POC (prediabetic range)  5.7 - 6.4 %   HbA1c, POC (controlled  diabetic range) 7.0 0.0 - 7.0 %   No results found.    Assessment and Plan: 62 y.o. male with   .Diagnoses and all orders for this visit:  History of recent pneumonia  Coronary artery disease involving native heart without angina pectoris, unspecified vessel or lesion type -     atenolol (TENORMIN) 25 MG tablet; Take 0.5 tablets (12.5 mg total) by mouth daily. -     empagliflozin (JARDIANCE) 10 MG TABS tablet; Take 10 mg by mouth daily.  Right upper lobe pulmonary nodule  Type 2 diabetes mellitus with hyperglycemia, without long-term current use of insulin (HCC) -     empagliflozin (JARDIANCE) 10 MG TABS tablet; Take 10 mg by mouth daily. -     POCT HgB A1C  Need for Tdap vaccination -     Tdap vaccine greater than or equal to 7yo IM   History of pneumonia - CXR to assess for resolution of RML opacity - clinically this has resolved. Discussed he is at increased risk due to his diabetes and heart disease. Pneumovax is UTD. Tdap updated today  Type 2 Diabetes Lab Results  Component Value Date   HGBA1C 7.0 12/29/2017  - patient is a good candidate for SGLT2 due to co-morbid CAD.  - cont Ozempic - adding Jardiance 10 mg daily - foot exam performed today and normal    Patient education and anticipatory guidance given Patient agrees with treatment plan Follow-up in 3 months for medication management or sooner as needed if symptoms worsen or fail to improve  Levonne Hubert PA-C

## 2018-01-12 ENCOUNTER — Other Ambulatory Visit: Payer: Self-pay

## 2018-01-12 MED ORDER — FLUOXETINE HCL 10 MG PO CAPS: 20.0000 mg | ORAL_CAPSULE | Freq: Every day | ORAL | 1 refills | Status: DC

## 2018-01-21 ENCOUNTER — Ambulatory Visit (HOSPITAL_COMMUNITY): Payer: Self-pay | Admitting: Psychiatry

## 2018-01-27 DIAGNOSIS — K8020 Calculus of gallbladder without cholecystitis without obstruction: Secondary | ICD-10-CM | POA: Diagnosis not present

## 2018-01-27 DIAGNOSIS — I10 Essential (primary) hypertension: Secondary | ICD-10-CM | POA: Diagnosis not present

## 2018-01-27 DIAGNOSIS — K8050 Calculus of bile duct without cholangitis or cholecystitis without obstruction: Secondary | ICD-10-CM | POA: Diagnosis not present

## 2018-01-27 DIAGNOSIS — R6889 Other general symptoms and signs: Secondary | ICD-10-CM | POA: Diagnosis not present

## 2018-02-01 ENCOUNTER — Ambulatory Visit (INDEPENDENT_AMBULATORY_CARE_PROVIDER_SITE_OTHER): Payer: Medicare Other | Admitting: Physician Assistant

## 2018-02-01 ENCOUNTER — Encounter: Payer: Self-pay | Admitting: Physician Assistant

## 2018-02-01 VITALS — BP 119/75 | HR 81 | Wt 146.0 lb

## 2018-02-01 DIAGNOSIS — E1165 Type 2 diabetes mellitus with hyperglycemia: Secondary | ICD-10-CM | POA: Diagnosis not present

## 2018-02-01 DIAGNOSIS — F39 Unspecified mood [affective] disorder: Secondary | ICD-10-CM

## 2018-02-01 DIAGNOSIS — I2510 Atherosclerotic heart disease of native coronary artery without angina pectoris: Secondary | ICD-10-CM | POA: Diagnosis not present

## 2018-02-01 DIAGNOSIS — E1151 Type 2 diabetes mellitus with diabetic peripheral angiopathy without gangrene: Secondary | ICD-10-CM

## 2018-02-01 DIAGNOSIS — Z23 Encounter for immunization: Secondary | ICD-10-CM | POA: Diagnosis not present

## 2018-02-01 DIAGNOSIS — R911 Solitary pulmonary nodule: Secondary | ICD-10-CM

## 2018-02-01 DIAGNOSIS — R6889 Other general symptoms and signs: Secondary | ICD-10-CM

## 2018-02-01 DIAGNOSIS — IMO0002 Reserved for concepts with insufficient information to code with codable children: Secondary | ICD-10-CM

## 2018-02-01 MED ORDER — FLUOXETINE HCL 20 MG PO CAPS: 20.0000 mg | ORAL_CAPSULE | Freq: Every day | ORAL | 0 refills | Status: DC

## 2018-02-01 MED ORDER — SEMAGLUTIDE(0.25 OR 0.5MG/DOS) 2 MG/1.5ML ~~LOC~~ SOPN: 0.5000 mg | PEN_INJECTOR | SUBCUTANEOUS | 1 refills | Status: DC

## 2018-02-01 NOTE — Patient Instructions (Signed)
Counseling: - psychologytoday.com: search engine to locate local counselors - Family Services in your county offer counseling on a sliding scale (pay what you can afford) - [facility]: we can place a referral for you to see one of licensed counselors in [place], [place], or [place] - online counseling: BetterHelp and [employer] (not covered by insurance, but affordable self-pay rates)  Other resources: - everydayhealth.com/depression/guide/resources/ - mindbodygreen.com - 7cupsoftea - greatist.com/grow/resources-when-you-can-not-afford-therapy  Safety Plan: if having self-harm or suicidal thoughts Our Office 336-992-1770 [facility] Hotline 336-832-9700 National Suicide Hotline 1-800-SUICIDE If in immediate danger of harming yourself, go to the nearest emergency room or call 911       

## 2018-02-01 NOTE — Progress Notes (Signed)
HPI:                                                                Mark Maynard. is a 62 y.o. male who presents [name] today for medication problem   States ever since his heart attack he has had less energy, cold intolerance and felt more down. He is wondering if this is his "new normal."  Reports he has been unable to fill his Prozac for over 1 month. He would like me to take over his medication management from psychiatry. Currently on Abilify 15 mg QHS and has only been taking Fluoxetine 10 mg daily so he does not run out of the medication. He was previously on 40 mg daily. He states that he notices he feels worse when he does not take Fluoxetine, but does not feel the medication works as well as it used to.  Denies SI/HI.   He saw his gastroenterologist on 01/27/18, who ordered some labs. CMP, TSH, and CBC were unremarkable. He states his home FBG's are in the 140's  He is planning on an elective cholecystectomy with Dr. Lu Duffel in February 2020. Cardiology will not clear him to hold his Plavix until he is 1 year out from his stent placement. ERCP and Colonoscopy also on hold until then.   Depression screen [facility] 2/9 09/13/2017 10/23/2016 04/28/2016  Decreased Interest 3 0 0  Down, Depressed, Hopeless 3 0 0  PHQ - 2 Score 6 0 0  Altered sleeping 3 - 0  Tired, decreased energy 3 - 0  Change in appetite 3 - 0  Feeling bad or failure about yourself  3 - 0  Trouble concentrating 3 - 1  Moving slowly or fidgety/restless 3 - 0  Suicidal thoughts 0 - 0  PHQ-9 Score 24 - 1  Some encounter information is confidential and restricted. Go to Review Flowsheets activity to see all data.    GAD 7 : Generalized Anxiety Score 09/13/2017  Nervous, Anxious, on Edge 3  Control/stop worrying 1  Worry too much - different things 3  Trouble relaxing 1  Restless 2  Easily annoyed or irritable 0  Afraid - awful might happen 2  Total GAD 7 Score  12  Some encounter information is confidential and restricted. Go to Review Flowsheets activity to see all data.      Past Medical History:  Diagnosis Date  . Anxiety   . CAD (coronary artery disease)    native 3-vessel disease  . Choledocholithiasis   . Depression   . Diabetes mellitus without complication (HCC)   . Hyperlipidemia   . Hypertension   . Myocardial infarction (HCC) 2012   x 3, s/p CABG, 9 stents  . Right middle lobe pneumonia (HCC) 11/09/2017  . Right upper lobe pulmonary nodule 11/09/2017   Past Surgical History:  Procedure Laterality Date  . CORONARY ARTERY BYPASS GRAFT  2012  . CORONARY ARTERY BYPASS GRAFT    . EYE SURGERY  1980's   ptyrgium removal  . PERCUTANEOUS CORONARY STENT INTERVENTION (PCI-S)  06/2017   Social History   Tobacco Use  . Smoking status: Never Smoker  . Smokeless tobacco: Never Used  Substance Use Topics  . Alcohol use:  Yes    Alcohol/week: 1.0 standard drinks    Types: 1 Cans of beer per week   family history includes Melanoma in his father.    ROS: negative except as noted in the HPI  Medications: Current Outpatient Medications  Medication Sig Dispense Refill  . AMBULATORY NON FORMULARY MEDICATION Single glucometer with lancets, test strips.   Check fasting morning blood sugar QD. Please bill with insurance. 1 each 0  . ARIPiprazole (ABILIFY) 15 MG tablet Take 1 tablet (15 mg total) by mouth at bedtime. 30 tablet 5  . aspirin EC 81 MG tablet Take 1 tablet (81 mg total) by mouth daily. 90 tablet 3  . atenolol (TENORMIN) 25 MG tablet Take 0.5 tablets (12.5 mg total) by mouth daily.    Marland Kitchen atorvastatin (LIPITOR) 80 MG tablet Take 1 tablet (80 mg total) by mouth daily. (Patient taking differently: Take 40 mg by mouth daily. ) 90 tablet 3  . clopidogrel (PLAVIX) 75 MG tablet Take 1 tablet (75 mg total) by mouth daily. 90 tablet 3  . empagliflozin (JARDIANCE) 10 MG TABS tablet Take 10 mg by mouth daily. 90 tablet 1  . FLUoxetine  (PROZAC) 20 MG capsule Take 1 capsule (20 mg total) by mouth at bedtime. 90 capsule 0  . isosorbide mononitrate (IMDUR) 30 MG 24 hr tablet Take 1 tablet (30 mg total) by mouth daily. 90 tablet 3  . Multiple Vitamin (MULTIVITAMIN) tablet Take by mouth.    . nitroGLYCERIN (NITROSTAT) 0.4 MG SL tablet Place 1 tablet (0.4 mg total) under the tongue every 5 (five) minutes as needed for chest pain (or tightness). 30 tablet 0  . pantoprazole (PROTONIX) 20 MG tablet Take 1 tablet (20 mg total) by mouth daily. 90 tablet 1  . [START ON 02/05/2018] Semaglutide (OZEMPIC) 0.25 or 0.5 MG/DOSE SOPN Inject 0.5 mg into the skin every Saturday at 6 PM. 12 pen 1   No current facility-administered medications for this visit.    No Known Allergies     Objective:  BP 119/75   Pulse 81   Wt 146 lb (66.2 kg)   BMI 22.20 kg/m  Gen:  alert, not ill-appearing, no distress, appropriate for age HEENT: head normocephalic without obvious abnormality, conjunctiva and cornea clear, trachea midline, no carotid bruit Pulm: Normal work of breathing, normal phonation, clear to auscultation bilaterally, no wheezes, rales or rhonchi CV: Normal rate, regular rhythm, s1 and s2 distinct, no murmurs, clicks or rubs  Neuro: alert and oriented x 3, no tremor MSK: extremities atraumatic, normal gait and station Skin: intact, no rashes on exposed skin, no jaundice, no cyanosis Psych: appearance casual, cooperative, good eye contact, depressed mood, affect mood-congruent, speech is articulate, and thought processes clear and goal-directed    No results found for this or any previous visit (from the past 72 hour(s)). No results found.  Result Date: 11/24/2017 TECHNIQUE: XR CHEST PA AND LATERAL INDICATION: Pneumonia COMPARISON: 07/10/2017 FINDINGS: Normal cardiac and mediastinal contours. Lungs are expanded. There is a new 2.2 cm opacity near the right lateral chest wall on the frontal view. This is not present on the prior study.  Pulmonary infiltrates or mass or possibly rib lesion considered. This is not visible on the lateral view. Further evaluation with CT is indicated. There is no pleural effusion or pneumothorax.   IMPRESSION: There is a new 2.2 cm opacity near the right lateral chest wall on the frontal view. This was not present on the prior study. Pulmonary infiltrate or mass  or possibly rib lesion considered. This is not visible on the lateral view. Further evaluation with CT is indicated. Electronically Signed by: Ainsley Spinner  Ct Chest Wo Iv Contrast  Result Date: 11/25/2017 TECHNIQUE: Axial CT of the chest without contrast. Radiation dose reduction was utilized (automated exposure control, mA or kV adjustment based on patient size, or iterative image reconstruction). COMPARISON: Radiographs 11/24/2017 INDICATION: lung nodule FINDINGS: Vascular: Diffuse coronary artery calcifications status post CABG. Heart/pericardium: Within normal limits. Thoracic inlet/axilla: Within normal limits. Mediastinum/hila: Within normal limits. Upper abdomen: Cholelithiasis. No pericholecystic inflammatory change. Trachea/central airways: Within normal limits. Lungs/pleura: Irregular opacity in the lateral aspect of the right middle lobe that measures up to 3 cm on series 3 image 50 and sagittal reformat image 42. Broad contact with the pleura. There is some slight thickening of the adjacent major fissure. No other opacities. No pleural effusion. Tiny calcified granuloma right lower lobe. Musculoskeletal: No acute osseous abnormality. Sternotomy wires.   IMPRESSION: Irregular 3 cm opacity in the right middle lobe laterally which has broad contact with the pleura. Appearance certainly concerning for neoplasm. However this does not appear present relatively recent radiograph and could conceivably be infectious if clinical history is supportive. Recommend either close/short-term imaging follow-up to assess for resolution or biopsy as  clinically warranted. Electronically Signed by: Stephens November    Assessment and Plan: 62 y.o. male with   .Diagnoses and all orders for this visit:  Mood disorder (HCC) -     FLUoxetine (PROZAC) 20 MG capsule; Take 1 capsule (20 mg total) by mouth at bedtime.  Uncontrolled diabetes mellitus type 2 with peripheral artery disease (HCC) -     Semaglutide (OZEMPIC) 0.25 or 0.5 MG/DOSE SOPN; Inject 0.5 mg into the skin every Saturday at 6 PM.  Cold intolerance  Right upper lobe pulmonary nodule  Right middle lobe pulmonary nodule Comments: CT chest 11/25/17  Irregular 3 cm opacity in the right middle lobe laterally which has broad contact with the pleura. Appearance certainly concerning for neoplasm Orders: -     Ambulatory referral to Pulmonology   Pulmonary nodule RUL nodule was initially visualized on CT angio 11/09/17. At the time, patient was having clinical symptoms of pneumonia. He was hospitalized for DKA 11/24/17-11/28/17 and a chest CT w/o contrast was performed, which showed a new 3 cm opacity in the RML c/f neoplasm. A f/u CXR 12/29/17 was negative. Given his persistent fatigue and chills, I will refer him to pulmonology to determine if he would meet criteria for biopsy or PET scan  Mood disorder Contacted [facility] pharmacy. Pharmacy stated they were only giving him 7 day supply because psychiatrist would not authorize further refills without an office visit. Clarified that we will be the prescriber going forward We discussed that it is not uncommon after a heart attack to have mood changes, we will treat this as we would any depressive episode, and with appropriate treatment I do not think this will be his "new normal" Refilled Fluoxetine 20 mg today Cont Abilify 15 mg QHS Re-assess in 8 weeks  Patient education and anticipatory guidance given Patient agrees with treatment plan Follow-up in 8 weeks or sooner as needed if symptoms worsen or fail to improve  Levonne Hubert PA-C

## 2018-02-02 ENCOUNTER — Encounter: Payer: Self-pay | Admitting: Physician Assistant

## 2018-02-02 DIAGNOSIS — R911 Solitary pulmonary nodule: Secondary | ICD-10-CM | POA: Insufficient documentation

## 2018-02-02 DIAGNOSIS — R6889 Other general symptoms and signs: Secondary | ICD-10-CM | POA: Insufficient documentation

## 2018-02-10 ENCOUNTER — Institutional Professional Consult (permissible substitution): Payer: Self-pay | Admitting: Pulmonary Disease

## 2018-02-10 DIAGNOSIS — I25708 Atherosclerosis of coronary artery bypass graft(s), unspecified, with other forms of angina pectoris: Secondary | ICD-10-CM | POA: Diagnosis not present

## 2018-02-10 DIAGNOSIS — I10 Essential (primary) hypertension: Secondary | ICD-10-CM | POA: Diagnosis not present

## 2018-02-10 DIAGNOSIS — E785 Hyperlipidemia, unspecified: Secondary | ICD-10-CM | POA: Diagnosis not present

## 2018-02-10 DIAGNOSIS — R0602 Shortness of breath: Secondary | ICD-10-CM | POA: Diagnosis not present

## 2018-02-10 NOTE — Progress Notes (Deleted)
Synopsis: Referred in September 2019 for a pulmonary nodule  Subjective:   PATIENT ID: Mark Maynard. GENDER: male DOB: [birth year], MRN: [id number]   HPI  No chief complaint on file.   ***  Past Medical History:  Diagnosis Date  . Anxiety   . CAD (coronary artery disease)    native 3-vessel disease  . Choledocholithiasis   . Depression   . Diabetes mellitus without complication (HCC)   . Hyperlipidemia   . Hypertension   . Myocardial infarction (HCC) 2012   x 3, s/p CABG, 9 stents  . Right middle lobe pneumonia (HCC) 11/09/2017  . Right upper lobe pulmonary nodule 11/09/2017     Family History  Problem Relation Age of Onset  . Melanoma Father      Social History   Socioeconomic History  . Marital status: Divorced    Spouse name: Not on file  . Number of children: Not on file  . Years of education: Not on file  . Highest education level: Not on file  Occupational History  . Not on file  Social Needs  . Financial resource strain: Not on file  . Food insecurity:    Worry: Not on file    Inability: Not on file  . Transportation needs:    Medical: Not on file    Non-medical: Not on file  Tobacco Use  . Smoking status: Never Smoker  . Smokeless tobacco: Never Used  Substance and Sexual Activity  . Alcohol use: Yes    Alcohol/week: 1.0 standard drinks    Types: 1 Cans of beer per week  . Drug use: No  . Sexual activity: Yes    Birth control/protection: None  Lifestyle  . Physical activity:    Days per week: Not on file    Minutes per session: Not on file  . Stress: Not on file  Relationships  . Social connections:    Talks on phone: Not on file    Gets together: Not on file    Attends religious service: Not on file    Active member of club or organization: Not on file    Attends meetings of clubs or organizations: Not on file    Relationship status: Not on file  . Intimate partner violence:    Fear of current or ex partner: Not on file   Emotionally abused: Not on file    Physically abused: Not on file    Forced sexual activity: Not on file  Other Topics Concern  . Not on file  Social History Narrative  . Not on file     No Known Allergies   Outpatient Medications Prior to Visit  Medication Sig Dispense Refill  . AMBULATORY NON FORMULARY MEDICATION Single glucometer with lancets, test strips.   Check fasting morning blood sugar QD. Please bill with insurance. 1 each 0  . ARIPiprazole (ABILIFY) 15 MG tablet Take 1 tablet (15 mg total) by mouth at bedtime. 30 tablet 5  . aspirin EC 81 MG tablet Take 1 tablet (81 mg total) by mouth daily. 90 tablet 3  . atenolol (TENORMIN) 25 MG tablet Take 0.5 tablets (12.5 mg total) by mouth daily.    Marland Kitchen atorvastatin (LIPITOR) 80 MG tablet Take 1 tablet (80 mg total) by mouth daily. (Patient taking differently: Take 40 mg by mouth daily. ) 90 tablet 3  . clopidogrel (PLAVIX) 75 MG tablet Take 1 tablet (75 mg total) by mouth daily. 90 tablet 3  . empagliflozin (JARDIANCE)  10 MG TABS tablet Take 10 mg by mouth daily. 90 tablet 1  . FLUoxetine (PROZAC) 20 MG capsule Take 1 capsule (20 mg total) by mouth at bedtime. 90 capsule 0  . isosorbide mononitrate (IMDUR) 30 MG 24 hr tablet Take 1 tablet (30 mg total) by mouth daily. 90 tablet 3  . Multiple Vitamin (MULTIVITAMIN) tablet Take by mouth.    . nitroGLYCERIN (NITROSTAT) 0.4 MG SL tablet Place 1 tablet (0.4 mg total) under the tongue every 5 (five) minutes as needed for chest pain (or tightness). 30 tablet 0  . pantoprazole (PROTONIX) 20 MG tablet Take 1 tablet (20 mg total) by mouth daily. 90 tablet 1  . Semaglutide (OZEMPIC) 0.25 or 0.5 MG/DOSE SOPN Inject 0.5 mg into the skin every Saturday at 6 PM. 12 pen 1   No facility-administered medications prior to visit.     ROS    Objective:  Physical Exam   There were no vitals filed for this visit.  ***  CBC    Component Value Date/Time   WBC 13.6 (H) 11/08/2017 1427   RBC  4.73 11/08/2017 1427   HGB 14.8 11/08/2017 1427   HCT 41.5 11/08/2017 1427   PLT 366 11/08/2017 1427   MCV 87.7 11/08/2017 1427   MCH 31.3 11/08/2017 1427   MCHC 35.7 11/08/2017 1427   RDW 11.5 11/08/2017 1427   LYMPHSABS 1,482 11/08/2017 1427   EOSABS 41 11/08/2017 1427   BASOSABS 27 11/08/2017 1427     Chest imaging: 10/2017 CT angiogram chest: No pulmonary embolism, There is a 4.5 cm opacity in the RML suggestive of pneumonia 12/2017 CXR images independently reviewed showing no pulmonary parenchymal abnormality in the right lung  PFT:  Labs:  Path:  Echo:  Heart Catheterization:   Records from his visit with [facility] reviewed where he was seen for a pulmonary nodule and was referred to us for the same.    Assessment & Plan:   No diagnosis found.  Discussion: ***    Current Outpatient Medications:  .  AMBULATORY NON FORMULARY MEDICATION, Single glucometer with lancets, test strips.   Check fasting morning blood sugar QD. Please bill with insurance., Disp: 1 each, Rfl: 0 .  ARIPiprazole (ABILIFY) 15 MG tablet, Take 1 tablet (15 mg total) by mouth at bedtime., Disp: 30 tablet, Rfl: 5 .  aspirin EC 81 MG tablet, Take 1 tablet (81 mg total) by mouth daily., Disp: 90 tablet, Rfl: 3 .  atenolol (TENORMIN) 25 MG tablet, Take 0.5 tablets (12.5 mg total) by mouth daily., Disp: , Rfl:  .  atorvastatin (LIPITOR) 80 MG tablet, Take 1 tablet (80 mg total) by mouth daily. (Patient taking differently: Take 40 mg by mouth daily. ), Disp: 90 tablet, Rfl: 3 .  clopidogrel (PLAVIX) 75 MG tablet, Take 1 tablet (75 mg total) by mouth daily., Disp: 90 tablet, Rfl: 3 .  empagliflozin (JARDIANCE) 10 MG TABS tablet, Take 10 mg by mouth daily., Disp: 90 tablet, Rfl: 1 .  FLUoxetine (PROZAC) 20 MG capsule, Take 1 capsule (20 mg total) by mouth at bedtime., Disp: 90 capsule, Rfl: 0 .  isosorbide mononitrate (IMDUR) 30 MG 24 hr tablet, Take 1 tablet (30 mg total) by mouth daily.,  Disp: 90 tablet, Rfl: 3 .  Multiple Vitamin (MULTIVITAMIN) tablet, Take by mouth., Disp: , Rfl:  .  nitroGLYCERIN (NITROSTAT) 0.4 MG SL tablet, Place 1 tablet (0.4 mg total) under the tongue every 5 (five) minutes as needed for chest pain (  or tightness)., Disp: 30 tablet, Rfl: 0 .  pantoprazole (PROTONIX) 20 MG tablet, Take 1 tablet (20 mg total) by mouth daily., Disp: 90 tablet, Rfl: 1 .  Semaglutide (OZEMPIC) 0.25 or 0.5 MG/DOSE SOPN, Inject 0.5 mg into the skin every Saturday at 6 PM., Disp: 12 pen, Rfl: 1

## 2018-02-14 ENCOUNTER — Telehealth: Payer: Self-pay

## 2018-02-14 DIAGNOSIS — E1165 Type 2 diabetes mellitus with hyperglycemia: Secondary | ICD-10-CM

## 2018-02-14 MED ORDER — DULAGLUTIDE 1.5 MG/0.5ML ~~LOC~~ SOAJ: 1.5000 mg | SUBCUTANEOUS | 11 refills | Status: DC

## 2018-02-14 NOTE — Telephone Encounter (Signed)
Received RX for Ozempic- will cost him $1200.   Wants to know if there can be alternative called in that is more cost effective. Currently completely out of insulin.  ( I called [facility] and they state that is just the cost with his insurance. Pt has [employer] so there is no 'donut hole' that is causing this to be expensive)   Also wanted me to note he has been checking sugars fasting in the AM and they range between 102-146.

## 2018-02-14 NOTE — Telephone Encounter (Signed)
Pt advised.

## 2018-02-14 NOTE — Telephone Encounter (Signed)
Switch from Ozempic to Trulicity

## 2018-02-15 ENCOUNTER — Ambulatory Visit (INDEPENDENT_AMBULATORY_CARE_PROVIDER_SITE_OTHER): Payer: Medicare Other | Admitting: Pulmonary Disease

## 2018-02-15 ENCOUNTER — Encounter: Payer: Self-pay | Admitting: Pulmonary Disease

## 2018-02-15 VITALS — BP 110/70 | HR 74 | Ht 68.0 in | Wt 145.0 lb

## 2018-02-15 DIAGNOSIS — R911 Solitary pulmonary nodule: Secondary | ICD-10-CM | POA: Diagnosis not present

## 2018-02-15 DIAGNOSIS — I2510 Atherosclerotic heart disease of native coronary artery without angina pectoris: Secondary | ICD-10-CM | POA: Diagnosis not present

## 2018-02-15 DIAGNOSIS — Z8701 Personal history of pneumonia (recurrent): Secondary | ICD-10-CM

## 2018-02-15 NOTE — Progress Notes (Signed)
Synopsis: Referred in September 2019 for a lung mass  Subjective:   PATIENT ID: Mark Maynard. GENDER: male DOB: 09-27-1955, MRN: [id number]   HPI  Chief Complaint  Patient presents with  . Pulm Consult    Referred by Gena Fray PA for nodule found on right lung. Patient denies any pulmonary symptoms.     This is a pleasant 62 year old male who comes to my clinic today for evaluation of an abnormal chest x-ray.  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  He has a past medical history significant for diabetes and coronary disease.  In June of this year he had a case of pneumonia.  He had cough, dyspnea and mucus production. He had DKA and a heart attack thereafter.   He says that since then he has been slowly recovering.  He had low energy for quite some time and some shortness of breath and cough.  However, the symptoms have resolved.  He is now walking 1 mile 3 times a week and is doing that quite well.  He has never smoked cigarettes.  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 Anxiety   . CAD (coronary artery disease)    native 3-vessel disease  . Choledocholithiasis   . Depression   . Diabetes mellitus without complication (HCC)   . Hyperlipidemia   . Hypertension   . Myocardial infarction (HCC) 2012   x 3, s/p CABG, 9 stents  . Right middle lobe pneumonia (HCC) 11/09/2017  . Right upper lobe pulmonary nodule 11/09/2017     Family History  Problem Relation Age of Onset  . Melanoma Father      Social History   Socioeconomic History  . Marital status: Divorced    Spouse name: Not on file  . Number of children: Not on file  . Years of education: Not on file  . Highest education level: Not on file  Occupational History  . Not on file  Social Needs  . Financial resource strain: Not on file  . Food insecurity:    Worry: Not on file    Inability: Not on file  .  Transportation needs:    Medical: Not on file    Non-medical: Not on file  Tobacco Use  . Smoking status: Never Smoker  . Smokeless tobacco: Never Used  Substance and Sexual Activity  . Alcohol use: Yes    Alcohol/week: 1.0 standard drinks    Types: 1 Cans of beer per week  . Drug use: No  . Sexual activity: Yes    Birth control/protection: None  Lifestyle  . Physical activity:    Days per week: Not on file    Minutes per session: Not on file  . Stress: Not on file  Relationships  . Social connections:    Talks on phone: Not on file    Gets together: Not on file    Attends religious service: Not on file    Active member of club or organization: Not on file    Attends meetings of clubs or organizations: Not on file    Relationship status: Not on file  . Intimate partner violence:    Fear of current or ex partner: Not on file    Emotionally abused: Not on file    Physically abused: Not on file    Forced sexual activity: Not on file  Other  Topics Concern  . Not on file  Social History Narrative  . Not on file     No Known Allergies   Outpatient Medications Prior to Visit  Medication Sig Dispense Refill  . AMBULATORY NON FORMULARY MEDICATION Single glucometer with lancets, test strips.   Check fasting morning blood sugar QD. Please bill with insurance. 1 each 0  . ARIPiprazole (ABILIFY) 15 MG tablet Take 1 tablet (15 mg total) by mouth at bedtime. 30 tablet 5  . aspirin EC 81 MG tablet Take 1 tablet (81 mg total) by mouth daily. 90 tablet 3  . atenolol (TENORMIN) 25 MG tablet Take 0.5 tablets (12.5 mg total) by mouth daily.    Marland Kitchen. atorvastatin (LIPITOR) 80 MG tablet Take 1 tablet (80 mg total) by mouth daily. (Patient taking differently: Take 40 mg by mouth daily. ) 90 tablet 3  . clopidogrel (PLAVIX) 75 MG tablet Take 1 tablet (75 mg total) by mouth daily. 90 tablet 3  . Dulaglutide (TRULICITY) 1.5 MG/0.5ML SOPN Inject 1.5 mg into the skin once a week. 4 pen 11  .  empagliflozin (JARDIANCE) 10 MG TABS tablet Take 10 mg by mouth daily. 90 tablet 1  . FLUoxetine (PROZAC) 20 MG capsule Take 1 capsule (20 mg total) by mouth at bedtime. 90 capsule 0  . isosorbide mononitrate (IMDUR) 30 MG 24 hr tablet Take 1 tablet (30 mg total) by mouth daily. 90 tablet 3  . Multiple Vitamin (MULTIVITAMIN) tablet Take by mouth.    . nitroGLYCERIN (NITROSTAT) 0.4 MG SL tablet Place 1 tablet (0.4 mg total) under the tongue every 5 (five) minutes as needed for chest pain (or tightness). 30 tablet 0  . pantoprazole (PROTONIX) 20 MG tablet Take 1 tablet (20 mg total) by mouth daily. 90 tablet 1   No facility-administered medications prior to visit.     Review of Systems  Constitutional: Negative for chills, fever, malaise/fatigue and weight loss.  HENT: Negative for congestion, nosebleeds, sinus pain and sore throat.   Eyes: Negative for photophobia, pain and discharge.  Respiratory: Negative for cough, hemoptysis, sputum production, shortness of breath and wheezing.   Cardiovascular: Negative for chest pain, palpitations, orthopnea and leg swelling.  Gastrointestinal: Negative for abdominal pain, constipation, diarrhea, nausea and vomiting.  Genitourinary: Negative for dysuria, frequency, hematuria and urgency.  Musculoskeletal: Negative for back pain, joint pain, myalgias and neck pain.  Skin: Negative for itching and rash.  Neurological: Negative for tingling, tremors, sensory change, speech change, focal weakness, seizures, weakness and headaches.  Psychiatric/Behavioral: Positive for depression. Negative for memory loss, substance abuse and suicidal ideas. The patient is nervous/anxious.       Objective:  Physical Exam   Vitals:   02/15/18 1512  BP: 110/70  Pulse: 74  SpO2: 98%  Weight: 145 lb (65.8 kg)  Height: 5\' 8"  (1.727 m)    Gen: well appearing, no acute distress HENT: NCAT, OP clear, neck supple without masses Eyes: PERRL, EOMi Lymph: no cervical  lymphadenopathy PULM: CTA B CV: RRR, no mgr, no JVD GI: BS+, soft, nontender, no hsm Derm: no rash or skin breakdown MSK: normal bulk and tone Neuro: A&Ox4, CN II-XII intact, strength 5/5 in all 4 extremities Psyche: normal mood and affect   CBC    Component Value Date/Time   WBC 13.6 (H) 11/08/2017 1427   RBC 4.73 11/08/2017 1427   HGB 14.8 11/08/2017 1427   HCT 41.5 11/08/2017 1427   PLT 366 11/08/2017 1427   MCV 87.7 11/08/2017  1427   MCH 31.3 11/08/2017 1427   MCHC 35.7 11/08/2017 1427   RDW 11.5 11/08/2017 1427   LYMPHSABS 1,482 11/08/2017 1427   EOSABS 41 11/08/2017 1427   BASOSABS 27 11/08/2017 1427     Chest imaging: 10/2017 CT angiogram chest > RML consolidation adjacent to pleura about 3cm in size 12/2017 CXR showed clearing of the infiltrate, images independently reviewed  PFT:  Labs:  Path:  Echo:  Heart Catheterization:  Case discussed with radiology, they say that there has been complete resolution of the consolidation and no further imaging is necessary     Assessment & Plan:   Right middle lobe pulmonary nodule  History of recent pneumonia  Discussion: Mr. Heidrich had community-acquired pneumonia earlier this year which did cause a large area of consolidation to form on his original chest x-ray and CT scan but fortunately all of this has resolved.  His clinical symptoms are consistent with a diagnosis of community-acquired pneumonia and he is slowly improving as one would expect for an individual with this diagnosis and his comorbid illnesses.  Plan: Abnormal chest x-ray: This was due to community-acquired pneumonia The problem has resolved ED no further imaging  Follow-up with [place] if you have other respiratory problems  > 50% of this 45 minute visit spent face to face     Current Outpatient Medications:  .  AMBULATORY NON FORMULARY MEDICATION, Single glucometer with lancets, test strips.   Check fasting morning blood sugar QD. Please bill  with insurance., Disp: 1 each, Rfl: 0 .  ARIPiprazole (ABILIFY) 15 MG tablet, Take 1 tablet (15 mg total) by mouth at bedtime., Disp: 30 tablet, Rfl: 5 .  aspirin EC 81 MG tablet, Take 1 tablet (81 mg total) by mouth daily., Disp: 90 tablet, Rfl: 3 .  atenolol (TENORMIN) 25 MG tablet, Take 0.5 tablets (12.5 mg total) by mouth daily., Disp: , Rfl:  .  atorvastatin (LIPITOR) 80 MG tablet, Take 1 tablet (80 mg total) by mouth daily. (Patient taking differently: Take 40 mg by mouth daily. ), Disp: 90 tablet, Rfl: 3 .  clopidogrel (PLAVIX) 75 MG tablet, Take 1 tablet (75 mg total) by mouth daily., Disp: 90 tablet, Rfl: 3 .  Dulaglutide (TRULICITY) 1.5 MG/0.5ML SOPN, Inject 1.5 mg into the skin once a week., Disp: 4 pen, Rfl: 11 .  empagliflozin (JARDIANCE) 10 MG TABS tablet, Take 10 mg by mouth daily., Disp: 90 tablet, Rfl: 1 .  FLUoxetine (PROZAC) 20 MG capsule, Take 1 capsule (20 mg total) by mouth at bedtime., Disp: 90 capsule, Rfl: 0 .  isosorbide mononitrate (IMDUR) 30 MG 24 hr tablet, Take 1 tablet (30 mg total) by mouth daily., Disp: 90 tablet, Rfl: 3 .  Multiple Vitamin (MULTIVITAMIN) tablet, Take by mouth., Disp: , Rfl:  .  nitroGLYCERIN (NITROSTAT) 0.4 MG SL tablet, Place 1 tablet (0.4 mg total) under the tongue every 5 (five) minutes as needed for chest pain (or tightness)., Disp: 30 tablet, Rfl: 0 .  pantoprazole (PROTONIX) 20 MG tablet, Take 1 tablet (20 mg total) by mouth daily., Disp: 90 tablet, Rfl: 1

## 2018-02-15 NOTE — Patient Instructions (Signed)
Abnormal chest x-ray: This was due to community-acquired pneumonia The problem has resolved ED no further imaging  Follow-up with us if you have other respiratory problems

## 2018-02-22 LAB — CBC WITH DIFFERENTIAL/PLATELET
BASOS ABS: 27 {cells}/uL (ref 0–200)
Basophils Relative: 0.2 %
EOS PCT: 0.3 %
Eosinophils Absolute: 41 cells/uL (ref 15–500)
HCT: 41.5 % (ref 38.5–50.0)
Hemoglobin: 14.8 g/dL (ref 13.2–17.1)
Lymphs Abs: 1482 cells/uL (ref 850–3900)
MCH: 31.3 pg (ref 27.0–33.0)
MCHC: 35.7 g/dL (ref 32.0–36.0)
MCV: 87.7 fL (ref 80.0–100.0)
MONOS PCT: 7.7 %
MPV: 9.3 fL (ref 7.5–12.5)
Neutro Abs: 11002 cells/uL — ABNORMAL HIGH (ref 1500–7800)
Neutrophils Relative %: 80.9 %
Platelets: 366 10*3/uL (ref 140–400)
RBC: 4.73 10*6/uL (ref 4.20–5.80)
RDW: 11.5 % (ref 11.0–15.0)
Total Lymphocyte: 10.9 %
WBC mixed population: 1047 cells/uL — ABNORMAL HIGH (ref 200–950)
WBC: 13.6 10*3/uL — ABNORMAL HIGH (ref 3.8–10.8)

## 2018-02-22 LAB — COMPREHENSIVE METABOLIC PANEL
AG RATIO: 1.6 (calc) (ref 1.0–2.5)
ALBUMIN MSPROF: 4.2 g/dL (ref 3.6–5.1)
ALT: 11 U/L (ref 9–46)
AST: 10 U/L (ref 10–35)
Alkaline phosphatase (APISO): 127 U/L — ABNORMAL HIGH (ref 40–115)
BILIRUBIN TOTAL: 0.7 mg/dL (ref 0.2–1.2)
BUN: 11 mg/dL (ref 7–25)
CALCIUM: 9.2 mg/dL (ref 8.6–10.3)
CHLORIDE: 99 mmol/L (ref 98–110)
CO2: 28 mmol/L (ref 20–32)
Creat: 0.75 mg/dL (ref 0.70–1.25)
GLOBULIN: 2.7 g/dL (ref 1.9–3.7)
Glucose, Bld: 107 mg/dL — ABNORMAL HIGH (ref 65–99)
POTASSIUM: 3.8 mmol/L (ref 3.5–5.3)
SODIUM: 138 mmol/L (ref 135–146)
Total Protein: 6.9 g/dL (ref 6.1–8.1)

## 2018-02-22 LAB — D-DIMER, QUANTITATIVE: D-Dimer, Quant: 1.12 mcg/mL FEU — ABNORMAL HIGH (ref <0.50)

## 2018-02-28 ENCOUNTER — Telehealth: Payer: Self-pay

## 2018-02-28 MED ORDER — METFORMIN HCL ER 500 MG PO TB24: 500.0000 mg | ORAL_TABLET | Freq: Two times a day (BID) | ORAL | 3 refills | Status: AC

## 2018-02-28 NOTE — Telephone Encounter (Signed)
Spoke with Pt, he has taken metformin in the past. Reports no negative side effects, he would like to try it again. Unsure why he was changed from it originally.   Would like Rx sent to [facility] in [place] (on file)

## 2018-02-28 NOTE — Telephone Encounter (Signed)
OK, new rx sent for Metformin.

## 2018-02-28 NOTE — Telephone Encounter (Signed)
Pt was recently on ozempic and was switched to jardiance due to the medication being so expensive.  Pt reports that the jardiance is too expensive as well.  He is out of medication.  Please advise. -EH/RMA

## 2018-02-28 NOTE — Telephone Encounter (Signed)
Pt advised.

## 2018-02-28 NOTE — Telephone Encounter (Signed)
Please call patient and see if he is able to take metformin?  I do not see it on his medication list and he does have normal kidney function.  I did not see it on his intolerance list either.

## 2018-03-21 ENCOUNTER — Other Ambulatory Visit: Payer: Self-pay | Admitting: Physician Assistant

## 2018-03-21 DIAGNOSIS — K219 Gastro-esophageal reflux disease without esophagitis: Secondary | ICD-10-CM

## 2018-03-30 DIAGNOSIS — K8050 Calculus of bile duct without cholangitis or cholecystitis without obstruction: Secondary | ICD-10-CM | POA: Diagnosis not present

## 2018-03-31 ENCOUNTER — Ambulatory Visit (INDEPENDENT_AMBULATORY_CARE_PROVIDER_SITE_OTHER): Payer: Medicare Other | Admitting: Physician Assistant

## 2018-03-31 ENCOUNTER — Encounter: Payer: Self-pay | Admitting: Physician Assistant

## 2018-03-31 VITALS — BP 107/67 | HR 78 | Wt 144.0 lb

## 2018-03-31 DIAGNOSIS — F439 Reaction to severe stress, unspecified: Secondary | ICD-10-CM | POA: Diagnosis not present

## 2018-03-31 DIAGNOSIS — I2510 Atherosclerotic heart disease of native coronary artery without angina pectoris: Secondary | ICD-10-CM | POA: Diagnosis not present

## 2018-03-31 DIAGNOSIS — E1165 Type 2 diabetes mellitus with hyperglycemia: Secondary | ICD-10-CM

## 2018-03-31 DIAGNOSIS — F430 Acute stress reaction: Secondary | ICD-10-CM

## 2018-03-31 DIAGNOSIS — F332 Major depressive disorder, recurrent severe without psychotic features: Secondary | ICD-10-CM | POA: Insufficient documentation

## 2018-03-31 LAB — POCT GLYCOSYLATED HEMOGLOBIN (HGB A1C): HBA1C, POC (PREDIABETIC RANGE): 6.2 % (ref 5.7–6.4)

## 2018-03-31 MED ORDER — FLUOXETINE HCL 10 MG PO CAPS: 10.0000 mg | ORAL_CAPSULE | Freq: Every day | ORAL | 0 refills | Status: DC

## 2018-03-31 MED ORDER — CLONAZEPAM 0.5 MG PO TABS: 0.5000 mg | ORAL_TABLET | Freq: Two times a day (BID) | ORAL | 0 refills | Status: DC | PRN

## 2018-03-31 MED ORDER — ESCITALOPRAM OXALATE 10 MG PO TABS: ORAL_TABLET | ORAL | 0 refills | Status: DC

## 2018-03-31 NOTE — Patient Instructions (Addendum)
Continue your Abilify as you are taking it for now Reduce your Prozac to 10 mg for 1 week, then okay to stop it At the same time, start Escitalopram (generic Lexapro) 10 mg for 1 week, then double your dose to 20 mg and continue at this dose You can take Clonazepam as needed for severe anxiety / panic attacks. Do not use for sleep. Do not combine with alcohol or other sedating medications    Safety Plan: if having self-harm or suicidal thoughts [facility] [place] 1-800-SUICIDE If in immediate danger of harming yourself, go to the nearest emergency room or call 911    Stress and Stress Management Stress is a normal reaction to life events. It is what you feel when life demands more than you are used to or more than you can handle. Some stress can be useful. For example, the stress reaction can help you catch the last bus of the day, study for a test, or meet a deadline at work. But stress that occurs too often or for too long can cause problems. It can affect your emotional health and interfere with relationships and normal daily activities. Too much stress can weaken your immune system and increase your risk for physical illness. If you already have a medical problem, stress can make it worse. What are the causes? All sorts of life events may cause stress. An event that causes stress for one person may not be stressful for another person. Major life events commonly cause stress. These may be positive or negative. Examples include losing your job, moving into a new home, getting married, having a baby, or losing a loved one. Less obvious life events may also cause stress, especially if they occur day after day or in combination. Examples include working long hours, driving in traffic, caring for children, being in debt, or being in a difficult relationship. What are the signs or symptoms? Stress may cause emotional symptoms including,  the following:  Anxiety. This is feeling worried, afraid, on edge, overwhelmed, or out of control.  Anger. This is feeling irritated or impatient.  Depression. This is feeling sad, down, helpless, or guilty.  Difficulty focusing, remembering, or making decisions.  Stress may cause physical symptoms, including the following:  Aches and pains. These may affect your head, neck, back, stomach, or other areas of your body.  Tight muscles or clenched jaw.  Low energy or trouble sleeping.  Stress may cause unhealthy behaviors, including the following:  Eating to feel better (overeating) or skipping meals.  Sleeping too little, too much, or both.  Working too much or putting off tasks (procrastination).  Smoking, drinking alcohol, or using drugs to feel better.  How is this diagnosed? Stress is diagnosed through an assessment by your health care provider. Your health care provider will ask questions about your symptoms and any stressful life events.Your health care provider will also ask about your medical history and may order blood tests or other tests. Certain medical conditions and medicine can cause physical symptoms similar to stress. Mental illness can cause emotional symptoms and unhealthy behaviors similar to stress. Your health care provider may refer you to a mental health professional for further evaluation. How is this treated? Stress management is the recommended treatment for stress.The goals of stress management are reducing stressful life events and coping with stress in healthy ways. Techniques for reducing stressful life events include the following:  Stress identification. Self-monitor for stress and identify  what causes stress for you. These skills may help you to avoid some stressful events.  Time management. Set your priorities, keep a calendar of events, and learn to say "no." These tools can help you avoid making too many commitments.  Techniques for coping  with stress include the following:  Rethinking the problem. Try to think realistically about stressful events rather than ignoring them or overreacting. Try to find the positives in a stressful situation rather than focusing on the negatives.  Exercise. Physical exercise can release both physical and emotional tension. The key is to find a form of exercise you enjoy and do it regularly.  Relaxation techniques. These relax the body and mind. Examples include yoga, meditation, tai chi, biofeedback, deep breathing, progressive muscle relaxation, listening to music, being out in nature, journaling, and other hobbies. Again, the key is to find one or more that you enjoy and can do regularly.  Healthy lifestyle. Eat a balanced diet, get plenty of sleep, and do not smoke. Avoid using alcohol or drugs to relax.  Strong support network. Spend time with family, friends, or other people you enjoy being around.Express your feelings and talk things over with someone you trust.  Counseling or talktherapy with a mental health professional may be helpful if you are having difficulty managing stress on your own. Medicine is typically not recommended for the treatment of stress.Talk to your health care provider if you think you need medicine for symptoms of stress. Follow these instructions at home:  Keep all follow-up visits as directed by your health care provider.  Take all medicines as directed by your health care provider. Contact a health care provider if:  Your symptoms get worse or you start having new symptoms.  You feel overwhelmed by your problems and can no longer manage them on your own. Get help right away if:  You feel like hurting yourself or someone else. This information is not intended to replace advice given to you by your health care provider. Make sure you discuss any questions you have with your health care provider. Document Released: 11/04/2000 Document Revised: 10/17/2015  Document Reviewed: 01/03/2013 Elsevier Interactive Patient Education  2017 [employer].

## 2018-03-31 NOTE — Progress Notes (Signed)
HPI:                                                                Mark Maynard. is a 62 y.o. male who presents to [name]: [facility] today for follow-up  Patient is very upset today. Reports he has a  48 year old child that lives in [place] with their mother. He has been paying child support for this child for years, and reports that he recently received some sort of legal citation saying he owes $57,000 in child support. He is very distressed about this. Reports he is afraid is he going to be arrested. It has triggered worsening depressive symptoms and suicidal thoughts. He states he would not hurt himself and that he plans "to fight through this." He reports his Prozac and Abilify are not helping. Requesting anxiety medication. Has taken Xanax in the distant past and states this was not helpful.   Depression screen [facility] 2/9 03/31/2018 09/13/2017 10/23/2016 04/28/2016  Decreased Interest 3 3 0 0  Down, Depressed, Hopeless 3 3 0 0  PHQ - 2 Score 6 6 0 0  Altered sleeping 3 3 - 0  Tired, decreased energy 3 3 - 0  Change in appetite 2 3 - 0  Feeling bad or failure about yourself  3 3 - 0  Trouble concentrating 3 3 - 1  Moving slowly or fidgety/restless 2 3 - 0  Suicidal thoughts 1 0 - 0  PHQ-9 Score 23 24 - 1  Difficult doing work/chores Somewhat difficult - - -  Some encounter information is confidential and restricted. Go to Review Flowsheets activity to see all data.    GAD 7 : Generalized Anxiety Score 03/31/2018 09/13/2017  Nervous, Anxious, on Edge 3 3  Control/stop worrying 3 1  Worry too much - different things 3 3  Trouble relaxing 3 1  Restless 3 2  Easily annoyed or irritable 1 0  Afraid - awful might happen 3 2  Total GAD 7 Score 19 12  Anxiety Difficulty Somewhat difficult -  Some encounter information is confidential and restricted. Go to Review Flowsheets activity to see all data.      Past Medical History:  Diagnosis  Date  . Anxiety   . CAD (coronary artery disease)    native 3-vessel disease  . Choledocholithiasis   . Depression   . Diabetes mellitus without complication (HCC)   . Hyperlipidemia   . Hypertension   . Myocardial infarction (HCC) 2012   x 3, s/p CABG, 9 stents  . Right middle lobe pneumonia (HCC) 11/09/2017  . Right upper lobe pulmonary nodule 11/09/2017   Past Surgical History:  Procedure Laterality Date  . CORONARY ARTERY BYPASS GRAFT  2012  . CORONARY ARTERY BYPASS GRAFT    . EYE SURGERY  1980's   ptyrgium removal  . PERCUTANEOUS CORONARY STENT INTERVENTION (PCI-S)  06/2017   Social History   Tobacco Use  . Smoking status: Never Smoker  . Smokeless tobacco: Never Used  Substance Use Topics  . Alcohol use: Yes    Alcohol/week: 1.0 standard drinks    Types: 1 Cans of beer per week   family history includes Melanoma in his father.    ROS: negative  except as noted in the HPI  Medications: Current Outpatient Medications  Medication Sig Dispense Refill  . AMBULATORY NON FORMULARY MEDICATION Single glucometer with lancets, test strips.   Check fasting morning blood sugar QD. Please bill with insurance. 1 each 0  . ARIPiprazole (ABILIFY) 15 MG tablet Take 1 tablet (15 mg total) by mouth at bedtime. 30 tablet 5  . aspirin EC 81 MG tablet Take 1 tablet (81 mg total) by mouth daily. 90 tablet 3  . atenolol (TENORMIN) 25 MG tablet Take 0.5 tablets (12.5 mg total) by mouth daily.    Marland Kitchen atorvastatin (LIPITOR) 80 MG tablet Take 1 tablet (80 mg total) by mouth daily. (Patient taking differently: Take 40 mg by mouth daily. ) 90 tablet 3  . clonazePAM (KLONOPIN) 0.5 MG tablet Take 1 tablet (0.5 mg total) by mouth every 12 (twelve) hours as needed for anxiety. 20 tablet 0  . clopidogrel (PLAVIX) 75 MG tablet Take 1 tablet (75 mg total) by mouth daily. 90 tablet 3  . Dulaglutide (TRULICITY) 1.5 MG/0.5ML SOPN Inject 1.5 mg into the skin once a week. 4 pen 11  . escitalopram (LEXAPRO)  10 MG tablet Take 1 tablet (10 mg total) by mouth at bedtime for 7 days, THEN 2 tablets (20 mg total) at bedtime for 23 days. 60 tablet 0  . FLUoxetine (PROZAC) 10 MG capsule Take 1 capsule (10 mg total) by mouth at bedtime for 7 days. 7 capsule 0  . isosorbide mononitrate (IMDUR) 30 MG 24 hr tablet Take 1 tablet (30 mg total) by mouth daily. 90 tablet 3  . metFORMIN (GLUCOPHAGE-XR) 500 MG 24 hr tablet Take 1 tablet (500 mg total) by mouth 2 (two) times daily. 60 tablet 3  . Multiple Vitamin (MULTIVITAMIN) tablet Take by mouth.    . nitroGLYCERIN (NITROSTAT) 0.4 MG SL tablet Place 1 tablet (0.4 mg total) under the tongue every 5 (five) minutes as needed for chest pain (or tightness). 30 tablet 0  . pantoprazole (PROTONIX) 20 MG tablet TAKE 1 TABLET BY MOUTH ONCE DAILY 90 tablet 1   No current facility-administered medications for this visit.    No Known Allergies     Objective:  BP 107/67   Pulse 78   Wt 144 lb (65.3 kg)   BMI 21.90 kg/m  Gen:  alert, not ill-appearing, no distress, appropriate for age HEENT: head normocephalic without obvious abnormality, conjunctiva and cornea clear, trachea midline Pulm: Normal work of breathing, normal phonation, clear to auscultation bilaterally, no wheezes, rales or rhonchi CV: Normal rate, regular rhythm, s1 and s2 distinct, no murmurs, clicks or rubs  Neuro: alert and oriented x 3, no tremor MSK: extremities atraumatic, normal gait and station Skin: intact, no rashes on exposed skin, no jaundice, no cyanosis Psych: appearance casual, cooperative, tearful, depressed mood, affect mood-congruent, speech is articulate, thought processes clear    Results for orders placed or performed in visit on 03/31/18 (from the past 72 hour(s))  POCT HgB A1C     Status: None   Collection Time: 03/31/18 11:23 AM  Result Value Ref Range   Hemoglobin A1C     HbA1c POC (<> result, manual entry)     HbA1c, POC (prediabetic range) 6.2 5.7 - 6.4 %   HbA1c, POC  (controlled diabetic range)     No results found.    Assessment and Plan: 62 y.o. male with   .Paydon was seen today for medication management.  Diagnoses and all orders for this visit:  Acute stress reaction with predominately emotional disturbance -     clonazePAM (KLONOPIN) 0.5 MG tablet; Take 1 tablet (0.5 mg total) by mouth every 12 (twelve) hours as needed for anxiety.  Type 2 diabetes mellitus with hyperglycemia, without long-term current use of insulin (HCC) -     POCT HgB A1C  Severe episode of recurrent major depressive disorder, without psychotic features (HCC) -     escitalopram (LEXAPRO) 10 MG tablet; Take 1 tablet (10 mg total) by mouth at bedtime for 7 days, THEN 2 tablets (20 mg total) at bedtime for 23 days. -     FLUoxetine (PROZAC) 10 MG capsule; Take 1 capsule (10 mg total) by mouth at bedtime for 7 days.   PHQ9=23, no acute safety issues Patient was verbally contracted for safety and provided with written safety plan Reviewed plan with his partner Patient was on Fluoxetine 40 mg approx 6 months ago and depression was not controlled. I am going to cross-taper off of Fluoxetine and on to Lexapro Continue Abilify 15 mg QHS for now Clonazepam 0.5 mg prn for severe anxiety/panic attacks Encouraged him to reach out to a family attorney regarding his legal situation    Patient education and anticipatory guidance given Patient agrees with treatment plan Follow-up in 1 week or sooner as needed if symptoms worsen or fail to improve  Levonne Hubert PA-C

## 2018-04-11 ENCOUNTER — Ambulatory Visit (INDEPENDENT_AMBULATORY_CARE_PROVIDER_SITE_OTHER): Payer: Medicare Other | Admitting: Physician Assistant

## 2018-04-11 ENCOUNTER — Encounter: Payer: Self-pay | Admitting: Physician Assistant

## 2018-04-11 VITALS — BP 122/76 | HR 82 | Wt 145.0 lb

## 2018-04-11 DIAGNOSIS — F39 Unspecified mood [affective] disorder: Secondary | ICD-10-CM

## 2018-04-11 DIAGNOSIS — F439 Reaction to severe stress, unspecified: Secondary | ICD-10-CM | POA: Diagnosis not present

## 2018-04-11 DIAGNOSIS — F430 Acute stress reaction: Secondary | ICD-10-CM

## 2018-04-11 DIAGNOSIS — I2510 Atherosclerotic heart disease of native coronary artery without angina pectoris: Secondary | ICD-10-CM | POA: Diagnosis not present

## 2018-04-11 MED ORDER — ESCITALOPRAM OXALATE 20 MG PO TABS: 20.0000 mg | ORAL_TABLET | Freq: Every day | ORAL | 2 refills | Status: DC

## 2018-04-11 NOTE — Progress Notes (Signed)
HPI:                                                                Mark Maynard. is a 62 y.o. male who presents to [name]: [facility] today for depression follow-up  Since last OV: He was cross tapered from fluoxetine to Lexapro.  He was also given clonazepam 0.5 mg as needed. Patient states "I am still hopeless but I am not balling my eyes out anymore."  He has been using the clonazepam as needed for panic attacks and he still has 8 tablets remaining.  He states this has been helpful for calming his thoughts.  He is still having significant sleep disturbance with premature awakening at 3 AM and inability to fall back asleep.  Reports he has an upcoming appointment at the [facility] hospital on December 27 and he is hopeful about this. Denies suicidal ideations or self-harm. Denies symptoms of mania or hypomania.  He also states that he went to pick up his refill of Trulicity and it was over $400.  He is unable to afford his medication.  Depression screen [facility] 2/9 03/31/2018 09/13/2017 10/23/2016 04/28/2016  Decreased Interest 3 3 0 0  Down, Depressed, Hopeless 3 3 0 0  PHQ - 2 Score 6 6 0 0  Altered sleeping 3 3 - 0  Tired, decreased energy 3 3 - 0  Change in appetite 2 3 - 0  Feeling bad or failure about yourself  3 3 - 0  Trouble concentrating 3 3 - 1  Moving slowly or fidgety/restless 2 3 - 0  Suicidal thoughts 1 0 - 0  PHQ-9 Score 23 24 - 1  Difficult doing work/chores Somewhat difficult - - -  Some encounter information is confidential and restricted. Go to Review Flowsheets activity to see all data.    GAD 7 : Generalized Anxiety Score 03/31/2018 09/13/2017  Nervous, Anxious, on Edge 3 3  Control/stop worrying 3 1  Worry too much - different things 3 3  Trouble relaxing 3 1  Restless 3 2  Easily annoyed or irritable 1 0  Afraid - awful might happen 3 2  Total GAD 7 Score 19 12  Anxiety Difficulty Somewhat difficult -  Some encounter  information is confidential and restricted. Go to Review Flowsheets activity to see all data.      Past Medical History:  Diagnosis Date  . Anxiety   . CAD (coronary artery disease)    native 3-vessel disease  . Choledocholithiasis   . Depression   . Diabetes mellitus without complication (HCC)   . Hyperlipidemia   . Hypertension   . Myocardial infarction (HCC) 2012   x 3, s/p CABG, 9 stents  . Right middle lobe pneumonia (HCC) 11/09/2017  . Right upper lobe pulmonary nodule 11/09/2017   Past Surgical History:  Procedure Laterality Date  . CORONARY ARTERY BYPASS GRAFT  2012  . CORONARY ARTERY BYPASS GRAFT    . EYE SURGERY  1980's   ptyrgium removal  . PERCUTANEOUS CORONARY STENT INTERVENTION (PCI-S)  06/2017   Social History   Tobacco Use  . Smoking status: Never Smoker  . Smokeless tobacco: Never Used  Substance Use Topics  . Alcohol use: Yes  Alcohol/week: 1.0 standard drinks    Types: 1 Cans of beer per week   family history includes Melanoma in his father.    ROS: negative except as noted in the HPI  Medications: Current Outpatient Medications  Medication Sig Dispense Refill  . AMBULATORY NON FORMULARY MEDICATION Single glucometer with lancets, test strips.   Check fasting morning blood sugar QD. Please bill with insurance. 1 each 0  . ARIPiprazole (ABILIFY) 15 MG tablet Take 1 tablet (15 mg total) by mouth at bedtime. 30 tablet 5  . aspirin EC 81 MG tablet Take 1 tablet (81 mg total) by mouth daily. 90 tablet 3  . atenolol (TENORMIN) 25 MG tablet Take 0.5 tablets (12.5 mg total) by mouth daily.    Marland Kitchen atorvastatin (LIPITOR) 80 MG tablet Take 1 tablet (80 mg total) by mouth daily. (Patient taking differently: Take 40 mg by mouth daily. ) 90 tablet 3  . clonazePAM (KLONOPIN) 0.5 MG tablet Take 1 tablet (0.5 mg total) by mouth every 12 (twelve) hours as needed for anxiety. 20 tablet 0  . clopidogrel (PLAVIX) 75 MG tablet Take 1 tablet (75 mg total) by mouth  daily. 90 tablet 3  . Dulaglutide (TRULICITY) 1.5 MG/0.5ML SOPN Inject 1.5 mg into the skin once a week. 4 pen 11  . escitalopram (LEXAPRO) 10 MG tablet Take 1 tablet (10 mg total) by mouth at bedtime for 7 days, THEN 2 tablets (20 mg total) at bedtime for 23 days. 60 tablet 0  . FLUoxetine (PROZAC) 10 MG capsule Take 1 capsule (10 mg total) by mouth at bedtime for 7 days. 7 capsule 0  . isosorbide mononitrate (IMDUR) 30 MG 24 hr tablet Take 1 tablet (30 mg total) by mouth daily. 90 tablet 3  . metFORMIN (GLUCOPHAGE-XR) 500 MG 24 hr tablet Take 1 tablet (500 mg total) by mouth 2 (two) times daily. 60 tablet 3  . Multiple Vitamin (MULTIVITAMIN) tablet Take by mouth.    . nitroGLYCERIN (NITROSTAT) 0.4 MG SL tablet Place 1 tablet (0.4 mg total) under the tongue every 5 (five) minutes as needed for chest pain (or tightness). 30 tablet 0  . pantoprazole (PROTONIX) 20 MG tablet TAKE 1 TABLET BY MOUTH ONCE DAILY 90 tablet 1   No current facility-administered medications for this visit.    No Known Allergies     Objective:  BP 122/76   Pulse 82   Wt 145 lb (65.8 kg)   BMI 22.05 kg/m  Gen:  alert, not ill-appearing, no distress, appropriate for age HEENT: head normocephalic without obvious abnormality, conjunctiva and cornea clear, trachea midline Pulm: Normal work of breathing, normal phonation Neuro: alert and oriented x 3, no tremor MSK: extremities atraumatic, normal gait and station Skin: intact, no rashes on exposed skin, no jaundice, no cyanosis Psych: appearance casual, cooperative, good eye contact, depressed mood, affect mood-congruent, speech is articulate, and thought processes clear and goal-directed, insight fair    No results found for this or any previous visit (from the past 72 hour(s)). No results found.    Assessment and Plan: 62 y.o. male with   .Tyton was seen today for follow-up.  Diagnoses and all orders for this visit:  Acute stress reaction with  predominately emotional disturbance  Mood disorder (HCC) -     escitalopram (LEXAPRO) 20 MG tablet; Take 1 tablet (20 mg total) by mouth at bedtime.    Mood disorder and acute stress reaction: Brallan appears to be in better spirits compared to last office  visit.  He denies any suicidal ideations.  Plan to continue Lexapro 20 mg nightly in addition to Abilify 15 mg.  Okay to continue clonazepam as needed, he was counseled on risks of this medication including dependence and he was counseled to use this medication judiciously.  Follow-up in 1 month.  We will contact [employer] to see if we can obtain Trulicity samples as patient is currently in the donut hole.  If not we will have to temporarily put him on insulin.  Patient education and anticipatory guidance given Patient agrees with treatment plan Follow-up in 1 month or sooner as needed if symptoms worsen or fail to improve  Levonne Hubertharley E. Simara Rhyner PA-C

## 2018-04-13 ENCOUNTER — Telehealth: Payer: Self-pay

## 2018-04-13 NOTE — Telephone Encounter (Signed)
Pt is currently in the donut hole and needing samples of Trulicity.   Pt is on the 1.5mg  dose. We have available for patient 2 boxes of the 0.75mg  dose. I spoke with provider and received the OK to give pt available samples and instruct him to give himself TWO injections of the 0.75mg  pens for 1 dose.   I explained this to patient over the phone and he states that he understands and will pick up samples tomorrow, 04/14/18.  I will be out of office tomorrow but have explained situation to Marylene Landngela so that she may re-explain directions for patient when he picks up.   I have also explained to the front desk to make sure Marylene Landngela is able to council patient on directions.

## 2018-04-14 ENCOUNTER — Encounter: Payer: Self-pay | Admitting: Physician Assistant

## 2018-04-14 NOTE — Telephone Encounter (Signed)
Patient came in office today and stated that he was in the donut hole and would need some more Trulicity to get him through next month. I called patient back and was able to get 3 boxes in the office. Patient will pick up tomorrow.    Lot Number: [id number] EXP:04/14/19.

## 2018-04-15 ENCOUNTER — Other Ambulatory Visit: Payer: Self-pay | Admitting: Physician Assistant

## 2018-04-15 DIAGNOSIS — F439 Reaction to severe stress, unspecified: Principal | ICD-10-CM

## 2018-04-15 DIAGNOSIS — F430 Acute stress reaction: Secondary | ICD-10-CM

## 2018-05-11 ENCOUNTER — Ambulatory Visit: Payer: Self-pay | Admitting: Physician Assistant

## 2018-06-06 ENCOUNTER — Other Ambulatory Visit: Payer: Self-pay

## 2018-06-06 DIAGNOSIS — E1165 Type 2 diabetes mellitus with hyperglycemia: Secondary | ICD-10-CM

## 2018-06-06 MED ORDER — DULAGLUTIDE 1.5 MG/0.5ML ~~LOC~~ SOAJ: 1.5000 mg | SUBCUTANEOUS | 11 refills | Status: AC

## 2018-10-04 DIAGNOSIS — E785 Hyperlipidemia, unspecified: Secondary | ICD-10-CM | POA: Diagnosis not present

## 2018-10-04 DIAGNOSIS — R0602 Shortness of breath: Secondary | ICD-10-CM | POA: Diagnosis not present

## 2018-10-04 DIAGNOSIS — I10 Essential (primary) hypertension: Secondary | ICD-10-CM | POA: Diagnosis not present

## 2018-10-04 DIAGNOSIS — I25708 Atherosclerosis of coronary artery bypass graft(s), unspecified, with other forms of angina pectoris: Secondary | ICD-10-CM | POA: Diagnosis not present

## 2019-06-08 DIAGNOSIS — R7989 Other specified abnormal findings of blood chemistry: Secondary | ICD-10-CM | POA: Diagnosis not present

## 2019-06-08 DIAGNOSIS — R112 Nausea with vomiting, unspecified: Secondary | ICD-10-CM | POA: Diagnosis not present

## 2019-06-08 DIAGNOSIS — I2510 Atherosclerotic heart disease of native coronary artery without angina pectoris: Secondary | ICD-10-CM | POA: Diagnosis not present

## 2019-06-08 DIAGNOSIS — Z7982 Long term (current) use of aspirin: Secondary | ICD-10-CM | POA: Diagnosis not present

## 2019-06-08 DIAGNOSIS — D72829 Elevated white blood cell count, unspecified: Secondary | ICD-10-CM | POA: Diagnosis not present

## 2019-06-08 DIAGNOSIS — I252 Old myocardial infarction: Secondary | ICD-10-CM | POA: Diagnosis not present

## 2019-06-08 DIAGNOSIS — E1165 Type 2 diabetes mellitus with hyperglycemia: Secondary | ICD-10-CM | POA: Diagnosis not present

## 2019-06-08 DIAGNOSIS — Z7984 Long term (current) use of oral hypoglycemic drugs: Secondary | ICD-10-CM | POA: Diagnosis not present

## 2019-06-08 DIAGNOSIS — K8020 Calculus of gallbladder without cholecystitis without obstruction: Secondary | ICD-10-CM | POA: Diagnosis not present

## 2019-06-08 DIAGNOSIS — F329 Major depressive disorder, single episode, unspecified: Secondary | ICD-10-CM | POA: Diagnosis not present

## 2019-06-08 DIAGNOSIS — R109 Unspecified abdominal pain: Secondary | ICD-10-CM | POA: Diagnosis not present

## 2019-06-08 DIAGNOSIS — I249 Acute ischemic heart disease, unspecified: Secondary | ICD-10-CM | POA: Diagnosis not present

## 2019-06-08 DIAGNOSIS — R9439 Abnormal result of other cardiovascular function study: Secondary | ICD-10-CM | POA: Diagnosis not present

## 2019-06-08 DIAGNOSIS — Z7902 Long term (current) use of antithrombotics/antiplatelets: Secondary | ICD-10-CM | POA: Diagnosis not present

## 2019-06-08 DIAGNOSIS — I25110 Atherosclerotic heart disease of native coronary artery with unstable angina pectoris: Secondary | ICD-10-CM | POA: Diagnosis not present

## 2019-06-08 DIAGNOSIS — Z951 Presence of aortocoronary bypass graft: Secondary | ICD-10-CM | POA: Diagnosis not present

## 2019-06-08 DIAGNOSIS — Z20822 Contact with and (suspected) exposure to covid-19: Secondary | ICD-10-CM | POA: Diagnosis not present

## 2019-06-08 DIAGNOSIS — R079 Chest pain, unspecified: Secondary | ICD-10-CM | POA: Diagnosis not present

## 2019-06-08 DIAGNOSIS — N281 Cyst of kidney, acquired: Secondary | ICD-10-CM | POA: Diagnosis not present

## 2019-06-08 DIAGNOSIS — R188 Other ascites: Secondary | ICD-10-CM | POA: Diagnosis not present

## 2019-06-08 DIAGNOSIS — I160 Hypertensive urgency: Secondary | ICD-10-CM | POA: Diagnosis not present

## 2019-06-08 DIAGNOSIS — I447 Left bundle-branch block, unspecified: Secondary | ICD-10-CM | POA: Diagnosis not present

## 2019-06-08 DIAGNOSIS — K8070 Calculus of gallbladder and bile duct without cholecystitis without obstruction: Secondary | ICD-10-CM | POA: Diagnosis not present

## 2019-06-08 DIAGNOSIS — E785 Hyperlipidemia, unspecified: Secondary | ICD-10-CM | POA: Diagnosis not present

## 2019-06-08 DIAGNOSIS — E1110 Type 2 diabetes mellitus with ketoacidosis without coma: Secondary | ICD-10-CM | POA: Diagnosis not present

## 2019-06-08 DIAGNOSIS — Z79899 Other long term (current) drug therapy: Secondary | ICD-10-CM | POA: Diagnosis not present

## 2019-06-08 DIAGNOSIS — I10 Essential (primary) hypertension: Secondary | ICD-10-CM | POA: Diagnosis not present

## 2019-06-08 DIAGNOSIS — F419 Anxiety disorder, unspecified: Secondary | ICD-10-CM | POA: Diagnosis not present

## 2019-06-08 DIAGNOSIS — I2583 Coronary atherosclerosis due to lipid rich plaque: Secondary | ICD-10-CM | POA: Diagnosis not present

## 2019-06-09 DIAGNOSIS — N281 Cyst of kidney, acquired: Secondary | ICD-10-CM | POA: Diagnosis not present

## 2019-06-09 DIAGNOSIS — I249 Acute ischemic heart disease, unspecified: Secondary | ICD-10-CM | POA: Diagnosis not present

## 2019-06-09 DIAGNOSIS — R112 Nausea with vomiting, unspecified: Secondary | ICD-10-CM | POA: Diagnosis not present

## 2019-06-09 DIAGNOSIS — R9439 Abnormal result of other cardiovascular function study: Secondary | ICD-10-CM | POA: Diagnosis not present

## 2019-06-09 DIAGNOSIS — K8020 Calculus of gallbladder without cholecystitis without obstruction: Secondary | ICD-10-CM | POA: Diagnosis not present

## 2019-06-09 DIAGNOSIS — I2510 Atherosclerotic heart disease of native coronary artery without angina pectoris: Secondary | ICD-10-CM | POA: Diagnosis not present

## 2019-06-09 DIAGNOSIS — R188 Other ascites: Secondary | ICD-10-CM | POA: Diagnosis not present

## 2019-06-09 DIAGNOSIS — R109 Unspecified abdominal pain: Secondary | ICD-10-CM | POA: Diagnosis not present

## 2019-06-09 DIAGNOSIS — I2583 Coronary atherosclerosis due to lipid rich plaque: Secondary | ICD-10-CM | POA: Diagnosis not present

## 2019-06-09 DIAGNOSIS — E1165 Type 2 diabetes mellitus with hyperglycemia: Secondary | ICD-10-CM | POA: Diagnosis not present

## 2019-06-09 MED ORDER — GENERIC EXTERNAL MEDICATION
Status: DC
Start: ? — End: 2019-06-09

## 2019-06-09 MED ORDER — ISOSORBIDE MONONITRATE ER 30 MG PO TB24
30.00 | ORAL_TABLET | ORAL | Status: DC
Start: 2019-06-10 — End: 2019-06-09

## 2019-06-09 MED ORDER — ATENOLOL 25 MG PO TABS
25.00 | ORAL_TABLET | ORAL | Status: DC
Start: 2019-06-10 — End: 2019-06-09

## 2019-06-09 MED ORDER — ALPRAZOLAM 0.25 MG PO TABS
0.25 | ORAL_TABLET | ORAL | Status: DC
Start: ? — End: 2019-06-09

## 2019-06-09 MED ORDER — PANTOPRAZOLE SODIUM 40 MG PO TBEC
40.00 | DELAYED_RELEASE_TABLET | ORAL | Status: DC
Start: 2019-06-10 — End: 2019-06-09

## 2019-06-09 MED ORDER — INSULIN LISPRO 100 UNIT/ML ~~LOC~~ SOLN
1.00 | SUBCUTANEOUS | Status: DC
Start: 2019-06-09 — End: 2019-06-09

## 2019-06-09 MED ORDER — DIPHENHYDRAMINE HCL 25 MG PO CAPS
25.00 | ORAL_CAPSULE | ORAL | Status: DC
Start: ? — End: 2019-06-09

## 2019-06-09 MED ORDER — CLOPIDOGREL BISULFATE 75 MG PO TABS
75.00 | ORAL_TABLET | ORAL | Status: DC
Start: 2019-06-10 — End: 2019-06-09

## 2019-06-09 MED ORDER — SODIUM CHLORIDE 0.9 % IV SOLN
100.00 | INTRAVENOUS | Status: DC
Start: ? — End: 2019-06-09

## 2019-06-09 MED ORDER — ESCITALOPRAM OXALATE 10 MG PO TABS
20.00 | ORAL_TABLET | ORAL | Status: DC
Start: 2019-06-10 — End: 2019-06-09

## 2019-06-09 MED ORDER — NITROGLYCERIN 0.4 MG SL SUBL
0.40 | SUBLINGUAL_TABLET | SUBLINGUAL | Status: DC
Start: ? — End: 2019-06-09

## 2019-06-09 MED ORDER — SENNOSIDES-DOCUSATE SODIUM 8.6-50 MG PO TABS
1.00 | ORAL_TABLET | ORAL | Status: DC
Start: ? — End: 2019-06-09

## 2019-06-09 MED ORDER — ATORVASTATIN CALCIUM 80 MG PO TABS
80.00 | ORAL_TABLET | ORAL | Status: DC
Start: 2019-06-10 — End: 2019-06-09

## 2019-06-09 MED ORDER — ASPIRIN 81 MG PO TBEC
81.00 | DELAYED_RELEASE_TABLET | ORAL | Status: DC
Start: 2019-06-10 — End: 2019-06-09

## 2019-06-09 MED ORDER — MORPHINE SULFATE 4 MG/ML IJ SOLN
2.00 | INTRAMUSCULAR | Status: DC
Start: ? — End: 2019-06-09

## 2019-06-09 MED ORDER — INSULIN LISPRO 100 UNIT/ML ~~LOC~~ SOLN
1.00 | SUBCUTANEOUS | Status: DC
Start: ? — End: 2019-06-09

## 2019-06-09 MED ORDER — SODIUM CHLORIDE 0.9 % IV SOLN
10.00 | INTRAVENOUS | Status: DC
Start: ? — End: 2019-06-09

## 2019-06-09 MED ORDER — METOCLOPRAMIDE HCL 5 MG/ML IJ SOLN
10.00 | INTRAMUSCULAR | Status: DC
Start: ? — End: 2019-06-09

## 2019-06-09 MED ORDER — MAGNESIUM HYDROXIDE 400 MG/5ML PO SUSP
30.00 | ORAL | Status: DC
Start: ? — End: 2019-06-09

## 2019-06-09 MED ORDER — ALUM & MAG HYDROXIDE-SIMETH 200-200-20 MG/5ML PO SUSP
30.00 | ORAL | Status: DC
Start: ? — End: 2019-06-09

## 2019-06-09 MED ORDER — INSULIN GLARGINE 100 UNIT/ML ~~LOC~~ SOLN
1.00 | SUBCUTANEOUS | Status: DC
Start: 2019-06-09 — End: 2019-06-09

## 2019-06-09 MED ORDER — INSULIN GLARGINE 100 UNIT/ML ~~LOC~~ SOLN
1.00 | SUBCUTANEOUS | Status: DC
Start: ? — End: 2019-06-09

## 2019-06-10 MED ORDER — GENERIC EXTERNAL MEDICATION
Status: DC
Start: ? — End: 2019-06-10

## 2019-07-01 IMAGING — CT CT ANGIO CHEST
2 of 8 series · 18 of 36 positions shown · IV contrast (iopamidol)
Comparison: Chest radiograph 11/08/2017

CLINICAL DATA: 61-year-old with hemoptysis, chest pain and positive
D-dimer. Evaluate for pulmonary embolism.

EXAM:
CT ANGIOGRAPHY CHEST WITH CONTRAST
TECHNIQUE: Multidetector CT imaging of the chest was performed using the
standard protocol during bolus administration of intravenous
contrast. Multiplanar CT image reconstructions and MIPs were
obtained to evaluate the vascular anatomy.
CONTRAST:  100mL [id number] IOPAMIDOL ([id number]) INJECTION 76%

[Series 6: pe thins · axial · 0.65mm/px · z∈[-260,-37]mm · 17 of 251 slices shown]
[im 14/251  lung]
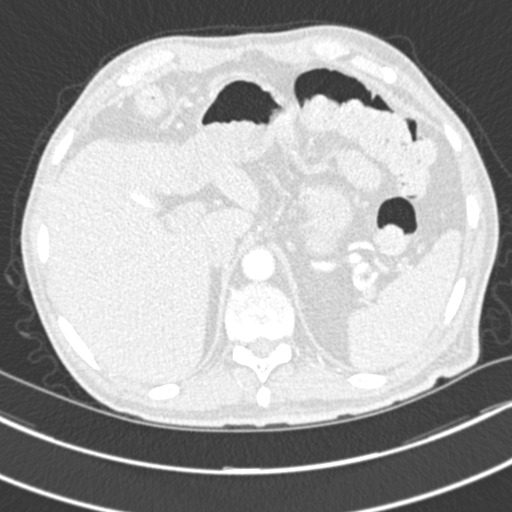
[im 27/251  mediastinal]
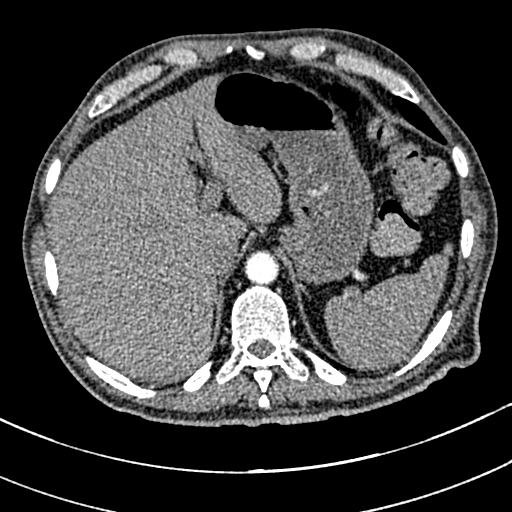
[im 40/251  lung]
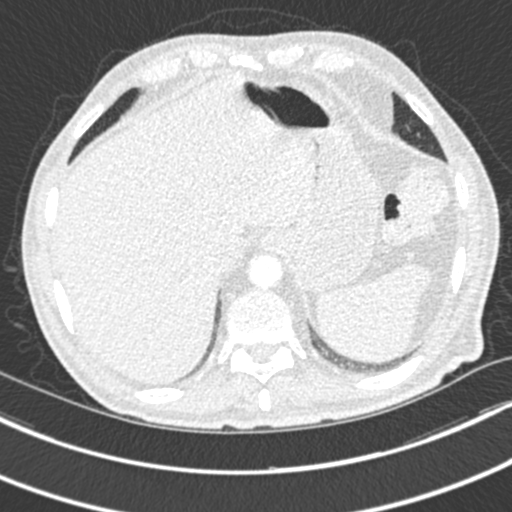
[im 53/251  mediastinal]
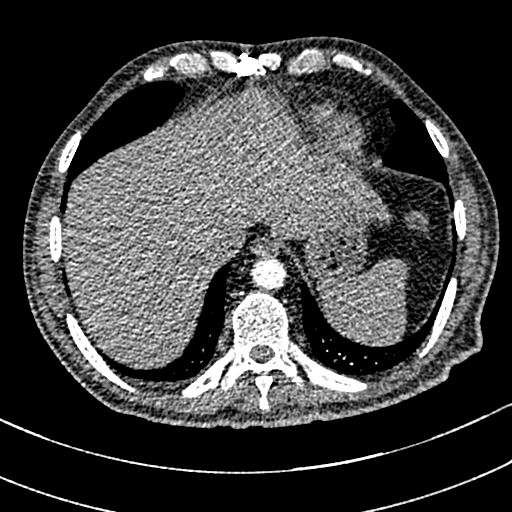
[im 66/251  lung]
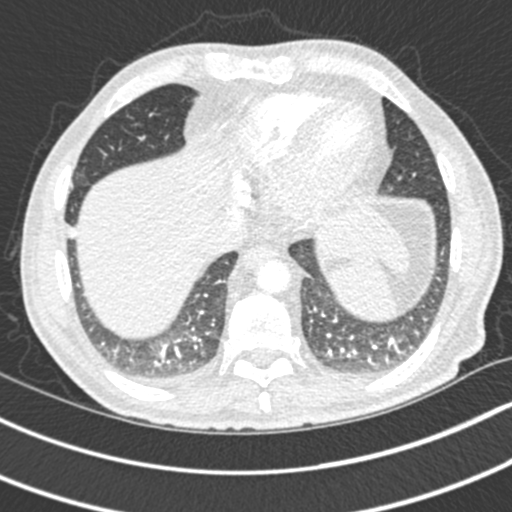
[im 79/251  mediastinal]
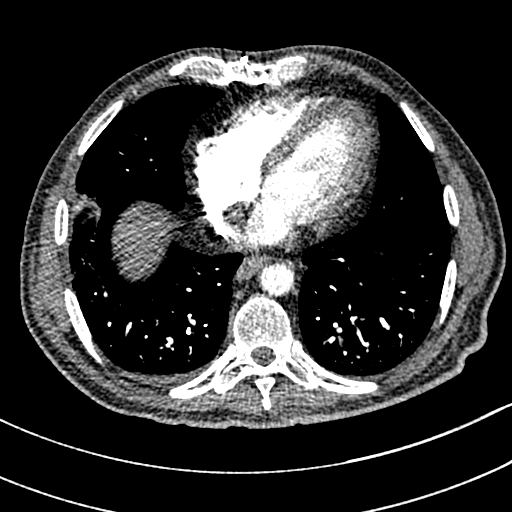
[im 93/251  lung]
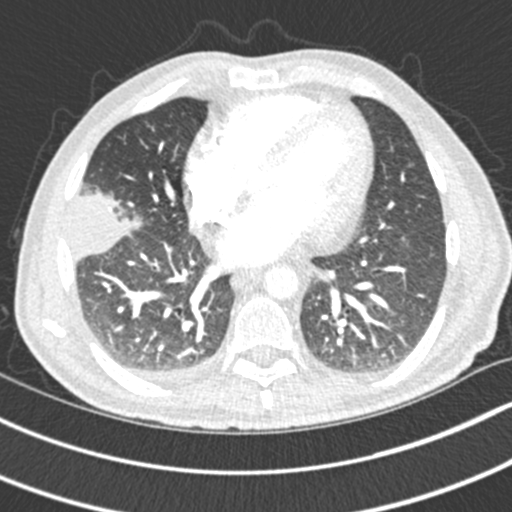
[im 106/251  mediastinal]
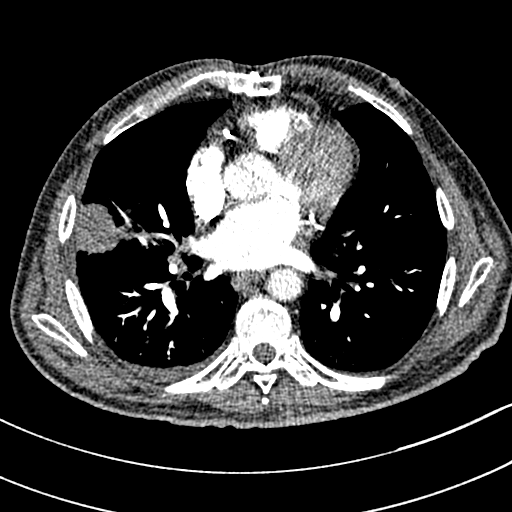
[im 132/251  lung]
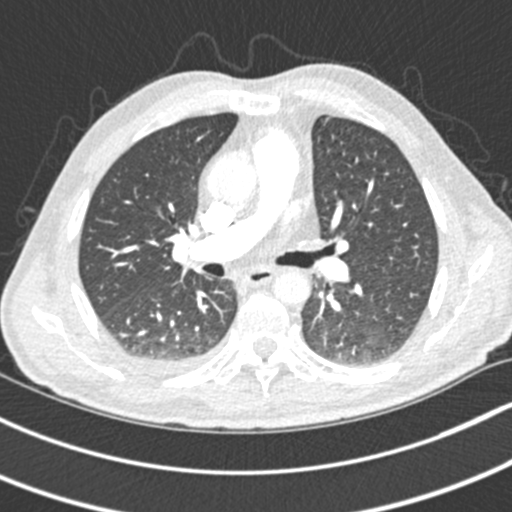
[im 145/251  mediastinal]
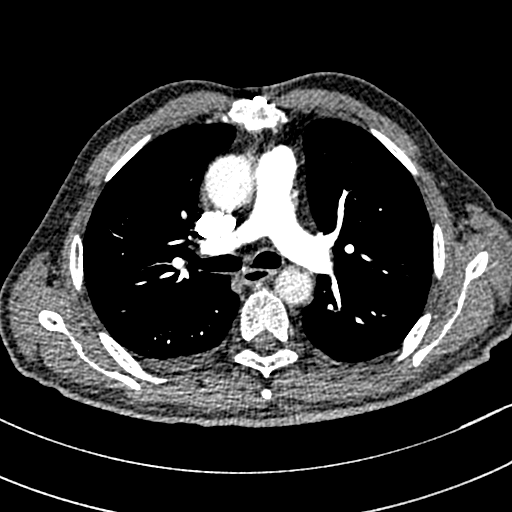
[im 158/251  lung]
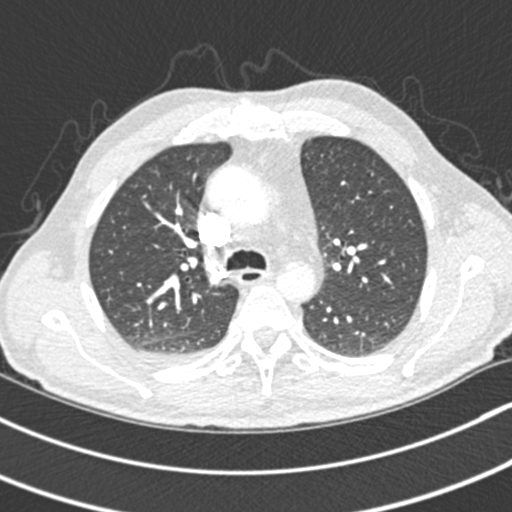
[im 172/251  mediastinal]
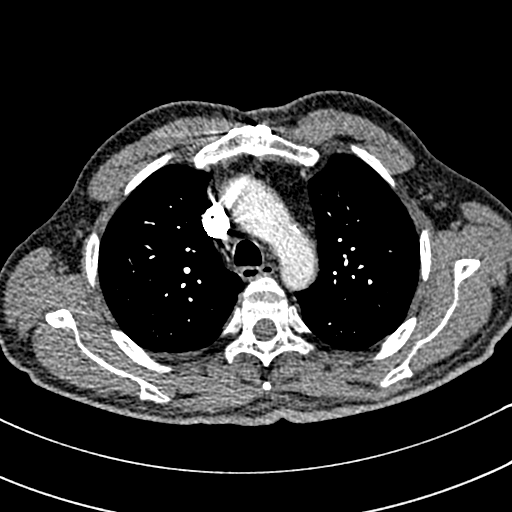
[im 185/251  lung]
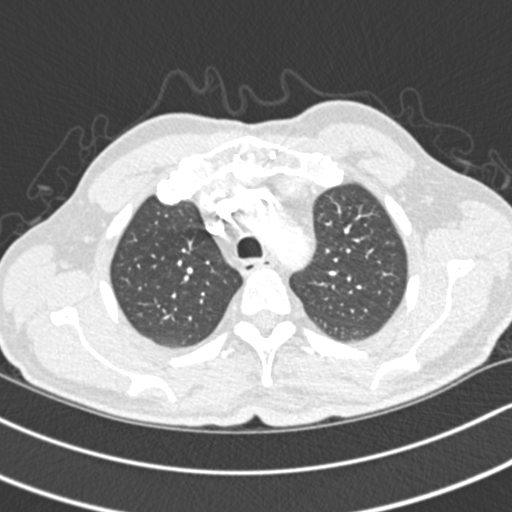
[im 198/251  mediastinal]
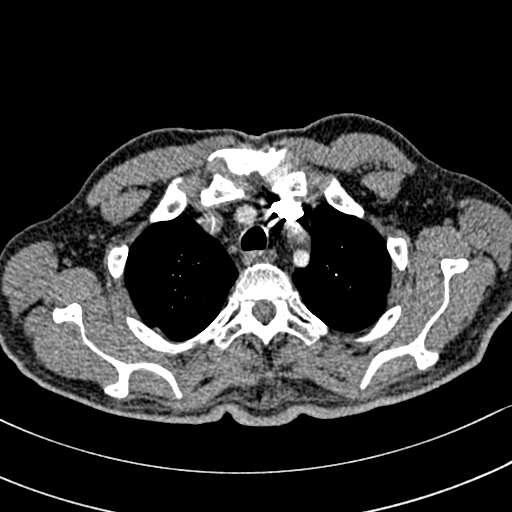
[im 211/251  lung]
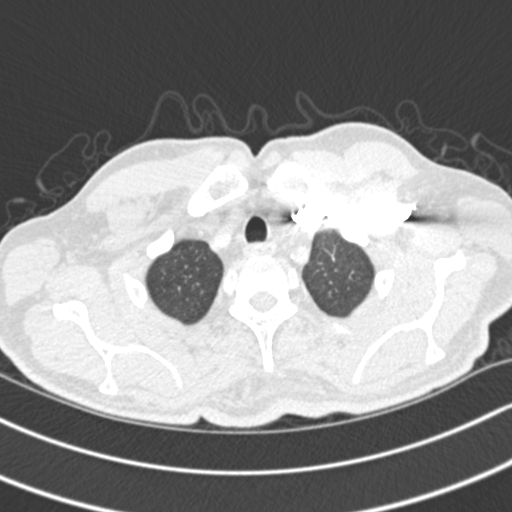
[im 224/251  mediastinal]
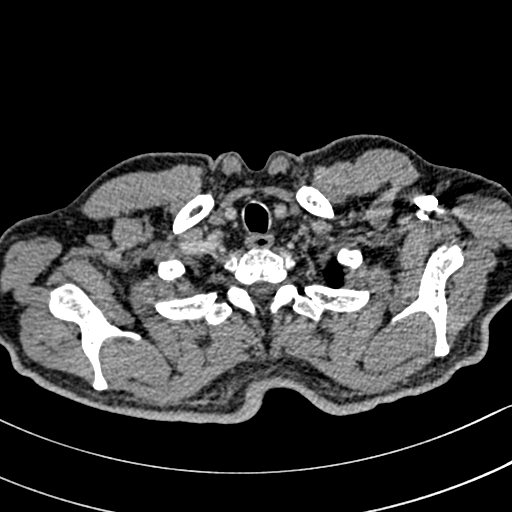
[im 237/251  lung]
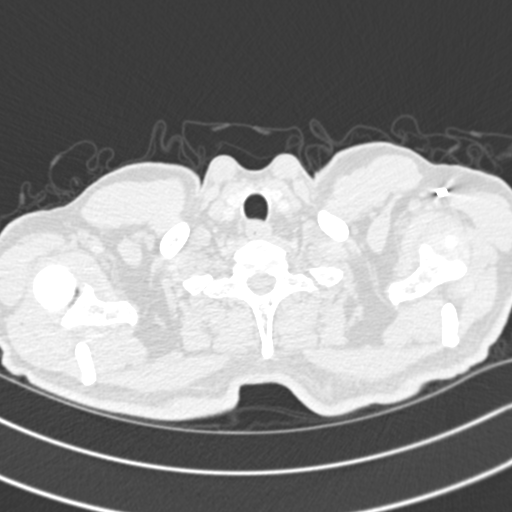

[Series 7: pe coronal mpr · coronal · 0.51mm/px · 1 of 126 slices shown]
[im 63/126  mediastinal]
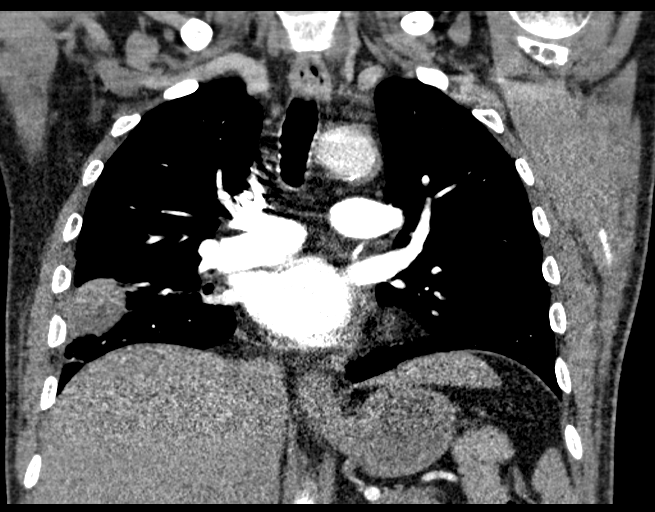

[18 of 36 positions shown; findings below may reference images not displayed]

FINDINGS: Cardiovascular: Negative for pulmonary embolism. Normal caliber of
the pulmonary arteries. Prior CABG procedure. Coronary artery
calcifications. Evidence for coronary stents but difficult to
differentiate from the coronary calcifications. Heart size is within
normal limits without significant pericardial fluid.

Mediastinum/Nodes: No significant lymph node enlargement in the
mediastinum, hilar or axillary regions.

Lungs/Pleura: Trace right pleural fluid. Peripheral opacity
corresponding with the radiographic abnormality in the right middle
lobe. This opacity measures 4.5 x 3.9 x 3.7 cm. Hazy densities in
the right lower lobe are likely related to atelectasis. Left lung is
clear. Nonspecific 3 mm nodule in the right upper lobe on sequence
5, image 21. Bandlike pleural-based density in the right lower lobe
on sequence 4, image 63 probably represents scarring or atelectasis.

Upper Abdomen: High-density material in the gallbladder lumen and
likely related to gallstones. Otherwise, images of the upper abdomen
are unremarkable.

Musculoskeletal: Prior sternotomy. No evidence for an acute bone
abnormality.

Review of the MIP images confirms the above findings.
IMPRESSION: Negative for pulmonary embolism.

Large peripheral opacity in the right middle lobe measuring up to
4.5 cm. Findings are suggestive for pneumonia but an underlying
neoplastic process cannot be excluded. Followup PA and lateral chest
X-ray is recommended in 3-4 weeks following trial of antibiotic
therapy to ensure resolution and exclude underlying malignancy.

Trace right pleural effusion.

Indeterminate 3 mm nodule in the right upper lobe. No follow-up
needed if patient is low-risk. Non-contrast chest CT can be
considered in 12 months if patient is high-risk. This recommendation
follows the consensus statement: Guidelines for Management of
Incidental Pulmonary Nodules Detected on CT Images: From the

Cholelithiasis.

## 2019-07-05 DIAGNOSIS — Z951 Presence of aortocoronary bypass graft: Secondary | ICD-10-CM | POA: Diagnosis not present

## 2019-07-05 DIAGNOSIS — I160 Hypertensive urgency: Secondary | ICD-10-CM | POA: Diagnosis not present

## 2019-07-05 DIAGNOSIS — E119 Type 2 diabetes mellitus without complications: Secondary | ICD-10-CM | POA: Diagnosis not present

## 2019-07-05 DIAGNOSIS — R000 Tachycardia, unspecified: Secondary | ICD-10-CM | POA: Diagnosis not present

## 2019-07-05 DIAGNOSIS — R0602 Shortness of breath: Secondary | ICD-10-CM | POA: Diagnosis not present

## 2019-07-05 DIAGNOSIS — R451 Restlessness and agitation: Secondary | ICD-10-CM | POA: Diagnosis not present

## 2019-07-05 DIAGNOSIS — F418 Other specified anxiety disorders: Secondary | ICD-10-CM | POA: Diagnosis not present

## 2019-07-05 DIAGNOSIS — I2510 Atherosclerotic heart disease of native coronary artery without angina pectoris: Secondary | ICD-10-CM | POA: Diagnosis not present

## 2019-07-05 DIAGNOSIS — Z794 Long term (current) use of insulin: Secondary | ICD-10-CM | POA: Diagnosis not present

## 2019-07-05 DIAGNOSIS — I454 Nonspecific intraventricular block: Secondary | ICD-10-CM | POA: Diagnosis not present

## 2019-07-05 DIAGNOSIS — E785 Hyperlipidemia, unspecified: Secondary | ICD-10-CM | POA: Diagnosis not present

## 2019-07-05 DIAGNOSIS — Z955 Presence of coronary angioplasty implant and graft: Secondary | ICD-10-CM | POA: Diagnosis not present

## 2019-07-05 DIAGNOSIS — I25119 Atherosclerotic heart disease of native coronary artery with unspecified angina pectoris: Secondary | ICD-10-CM | POA: Diagnosis not present

## 2019-07-05 DIAGNOSIS — I447 Left bundle-branch block, unspecified: Secondary | ICD-10-CM | POA: Diagnosis not present

## 2019-07-05 DIAGNOSIS — Z79899 Other long term (current) drug therapy: Secondary | ICD-10-CM | POA: Diagnosis not present

## 2019-07-05 DIAGNOSIS — R079 Chest pain, unspecified: Secondary | ICD-10-CM | POA: Diagnosis not present

## 2019-07-05 DIAGNOSIS — F419 Anxiety disorder, unspecified: Secondary | ICD-10-CM | POA: Diagnosis not present

## 2019-07-06 DIAGNOSIS — Z794 Long term (current) use of insulin: Secondary | ICD-10-CM | POA: Diagnosis not present

## 2019-07-06 DIAGNOSIS — Z951 Presence of aortocoronary bypass graft: Secondary | ICD-10-CM | POA: Diagnosis not present

## 2019-07-06 DIAGNOSIS — Z955 Presence of coronary angioplasty implant and graft: Secondary | ICD-10-CM | POA: Diagnosis not present

## 2019-07-06 DIAGNOSIS — I2510 Atherosclerotic heart disease of native coronary artery without angina pectoris: Secondary | ICD-10-CM | POA: Diagnosis not present

## 2019-07-06 DIAGNOSIS — E119 Type 2 diabetes mellitus without complications: Secondary | ICD-10-CM | POA: Diagnosis not present

## 2019-07-06 DIAGNOSIS — I160 Hypertensive urgency: Secondary | ICD-10-CM | POA: Diagnosis not present

## 2019-07-06 DIAGNOSIS — I25119 Atherosclerotic heart disease of native coronary artery with unspecified angina pectoris: Secondary | ICD-10-CM | POA: Diagnosis not present

## 2019-07-06 DIAGNOSIS — R079 Chest pain, unspecified: Secondary | ICD-10-CM | POA: Diagnosis not present

## 2019-07-06 DIAGNOSIS — R419 Unspecified symptoms and signs involving cognitive functions and awareness: Secondary | ICD-10-CM | POA: Diagnosis not present

## 2019-07-06 DIAGNOSIS — I447 Left bundle-branch block, unspecified: Secondary | ICD-10-CM | POA: Diagnosis not present

## 2019-07-06 DIAGNOSIS — F418 Other specified anxiety disorders: Secondary | ICD-10-CM | POA: Diagnosis not present

## 2019-07-06 DIAGNOSIS — F419 Anxiety disorder, unspecified: Secondary | ICD-10-CM | POA: Diagnosis not present

## 2019-07-06 DIAGNOSIS — E785 Hyperlipidemia, unspecified: Secondary | ICD-10-CM | POA: Diagnosis not present

## 2019-07-07 DIAGNOSIS — I160 Hypertensive urgency: Secondary | ICD-10-CM | POA: Diagnosis not present

## 2019-07-07 DIAGNOSIS — E119 Type 2 diabetes mellitus without complications: Secondary | ICD-10-CM | POA: Diagnosis not present

## 2019-07-07 DIAGNOSIS — Z955 Presence of coronary angioplasty implant and graft: Secondary | ICD-10-CM | POA: Diagnosis not present

## 2019-07-07 DIAGNOSIS — I2510 Atherosclerotic heart disease of native coronary artery without angina pectoris: Secondary | ICD-10-CM | POA: Diagnosis not present

## 2019-07-07 DIAGNOSIS — E785 Hyperlipidemia, unspecified: Secondary | ICD-10-CM | POA: Diagnosis not present

## 2019-07-20 DIAGNOSIS — Z79899 Other long term (current) drug therapy: Secondary | ICD-10-CM | POA: Diagnosis not present

## 2019-07-20 DIAGNOSIS — I10 Essential (primary) hypertension: Secondary | ICD-10-CM | POA: Diagnosis not present

## 2019-07-20 DIAGNOSIS — F319 Bipolar disorder, unspecified: Secondary | ICD-10-CM | POA: Diagnosis not present

## 2019-08-04 DIAGNOSIS — Z7901 Long term (current) use of anticoagulants: Secondary | ICD-10-CM | POA: Diagnosis not present

## 2019-08-04 DIAGNOSIS — Z951 Presence of aortocoronary bypass graft: Secondary | ICD-10-CM | POA: Diagnosis not present

## 2019-08-04 DIAGNOSIS — I2510 Atherosclerotic heart disease of native coronary artery without angina pectoris: Secondary | ICD-10-CM | POA: Diagnosis not present

## 2019-08-04 DIAGNOSIS — I10 Essential (primary) hypertension: Secondary | ICD-10-CM | POA: Diagnosis not present

## 2019-08-04 DIAGNOSIS — Z9861 Coronary angioplasty status: Secondary | ICD-10-CM | POA: Diagnosis not present

## 2019-08-04 DIAGNOSIS — Z7982 Long term (current) use of aspirin: Secondary | ICD-10-CM | POA: Diagnosis not present

## 2019-08-04 DIAGNOSIS — F419 Anxiety disorder, unspecified: Secondary | ICD-10-CM | POA: Diagnosis not present

## 2019-09-09 DIAGNOSIS — I700 Atherosclerosis of aorta: Secondary | ICD-10-CM | POA: Diagnosis not present

## 2019-09-09 DIAGNOSIS — A419 Sepsis, unspecified organism: Secondary | ICD-10-CM | POA: Diagnosis not present

## 2019-09-09 DIAGNOSIS — N3289 Other specified disorders of bladder: Secondary | ICD-10-CM | POA: Diagnosis not present

## 2019-09-09 DIAGNOSIS — Z79899 Other long term (current) drug therapy: Secondary | ICD-10-CM | POA: Diagnosis not present

## 2019-09-09 DIAGNOSIS — Z20822 Contact with and (suspected) exposure to covid-19: Secondary | ICD-10-CM | POA: Diagnosis not present

## 2019-09-09 DIAGNOSIS — K6389 Other specified diseases of intestine: Secondary | ICD-10-CM | POA: Diagnosis not present

## 2019-09-09 DIAGNOSIS — R918 Other nonspecific abnormal finding of lung field: Secondary | ICD-10-CM | POA: Diagnosis not present

## 2019-09-09 DIAGNOSIS — K8050 Calculus of bile duct without cholangitis or cholecystitis without obstruction: Secondary | ICD-10-CM | POA: Diagnosis not present

## 2019-09-09 DIAGNOSIS — I2510 Atherosclerotic heart disease of native coronary artery without angina pectoris: Secondary | ICD-10-CM | POA: Diagnosis not present

## 2019-09-09 DIAGNOSIS — J984 Other disorders of lung: Secondary | ICD-10-CM | POA: Diagnosis not present

## 2019-09-09 DIAGNOSIS — Z955 Presence of coronary angioplasty implant and graft: Secondary | ICD-10-CM | POA: Diagnosis not present

## 2019-09-09 DIAGNOSIS — R0602 Shortness of breath: Secondary | ICD-10-CM | POA: Diagnosis not present

## 2019-09-09 DIAGNOSIS — Z794 Long term (current) use of insulin: Secondary | ICD-10-CM | POA: Diagnosis not present

## 2019-09-09 DIAGNOSIS — I10 Essential (primary) hypertension: Secondary | ICD-10-CM | POA: Diagnosis not present

## 2019-09-09 DIAGNOSIS — E119 Type 2 diabetes mellitus without complications: Secondary | ICD-10-CM | POA: Diagnosis not present

## 2019-09-09 DIAGNOSIS — J189 Pneumonia, unspecified organism: Secondary | ICD-10-CM | POA: Diagnosis not present

## 2019-09-09 DIAGNOSIS — N400 Enlarged prostate without lower urinary tract symptoms: Secondary | ICD-10-CM | POA: Diagnosis not present

## 2019-09-09 DIAGNOSIS — R042 Hemoptysis: Secondary | ICD-10-CM | POA: Diagnosis not present

## 2019-09-09 DIAGNOSIS — R7989 Other specified abnormal findings of blood chemistry: Secondary | ICD-10-CM | POA: Diagnosis not present

## 2019-09-09 DIAGNOSIS — K8020 Calculus of gallbladder without cholecystitis without obstruction: Secondary | ICD-10-CM | POA: Diagnosis not present

## 2019-09-09 DIAGNOSIS — Z8719 Personal history of other diseases of the digestive system: Secondary | ICD-10-CM | POA: Diagnosis not present

## 2019-09-09 DIAGNOSIS — J9809 Other diseases of bronchus, not elsewhere classified: Secondary | ICD-10-CM | POA: Diagnosis not present

## 2019-09-09 DIAGNOSIS — Z7982 Long term (current) use of aspirin: Secondary | ICD-10-CM | POA: Diagnosis not present

## 2019-09-09 DIAGNOSIS — Z951 Presence of aortocoronary bypass graft: Secondary | ICD-10-CM | POA: Diagnosis not present

## 2019-09-09 DIAGNOSIS — I252 Old myocardial infarction: Secondary | ICD-10-CM | POA: Diagnosis not present

## 2019-09-13 MED ORDER — ONDANSETRON HCL 4 MG/2ML IJ SOLN
4.00 | INTRAMUSCULAR | Status: DC
Start: ? — End: 2019-09-13

## 2019-09-13 MED ORDER — INSULIN GLARGINE 100 UNIT/ML ~~LOC~~ SOLN
1.00 | SUBCUTANEOUS | Status: DC
Start: 2019-09-12 — End: 2019-09-13

## 2019-09-13 MED ORDER — THERA PO TABS
1.00 | ORAL_TABLET | ORAL | Status: DC
Start: 2019-09-13 — End: 2019-09-13

## 2019-09-13 MED ORDER — ASPIRIN 81 MG PO TBEC
81.00 | DELAYED_RELEASE_TABLET | ORAL | Status: DC
Start: 2019-09-13 — End: 2019-09-13

## 2019-09-13 MED ORDER — GENERIC EXTERNAL MEDICATION
Status: DC
Start: ? — End: 2019-09-13

## 2019-09-13 MED ORDER — CLOPIDOGREL BISULFATE 75 MG PO TABS
75.00 | ORAL_TABLET | ORAL | Status: DC
Start: 2019-09-13 — End: 2019-09-13

## 2019-09-13 MED ORDER — CARVEDILOL 25 MG PO TABS
25.00 | ORAL_TABLET | ORAL | Status: DC
Start: 2019-09-12 — End: 2019-09-13

## 2019-09-13 MED ORDER — INSULIN LISPRO 100 UNIT/ML ~~LOC~~ SOLN
1.00 | SUBCUTANEOUS | Status: DC
Start: ? — End: 2019-09-13

## 2019-09-13 MED ORDER — PANTOPRAZOLE SODIUM 40 MG PO TBEC
40.00 | DELAYED_RELEASE_TABLET | ORAL | Status: DC
Start: ? — End: 2019-09-13

## 2019-09-13 MED ORDER — ACETAMINOPHEN 325 MG PO TABS
650.00 | ORAL_TABLET | ORAL | Status: DC
Start: ? — End: 2019-09-13

## 2019-09-13 MED ORDER — INSULIN LISPRO 100 UNIT/ML ~~LOC~~ SOLN
1.00 | SUBCUTANEOUS | Status: DC
Start: 2019-09-12 — End: 2019-09-13

## 2019-09-13 MED ORDER — LISINOPRIL 20 MG PO TABS
20.00 | ORAL_TABLET | ORAL | Status: DC
Start: 2019-09-12 — End: 2019-09-13

## 2019-09-13 MED ORDER — SODIUM CHLORIDE 0.9 % IV SOLN
10.00 | INTRAVENOUS | Status: DC
Start: ? — End: 2019-09-13

## 2019-09-13 MED ORDER — MP TRI-FED COLD 2.5-60 MG PO TABS
25.00 | ORAL_TABLET | ORAL | Status: DC
Start: ? — End: 2019-09-13

## 2019-09-13 MED ORDER — ESCITALOPRAM OXALATE 10 MG PO TABS
20.00 | ORAL_TABLET | ORAL | Status: DC
Start: 2019-09-13 — End: 2019-09-13

## 2019-09-13 MED ORDER — LEVOFLOXACIN IN D5W 750 MG/150ML IV SOLN
750.00 | INTRAVENOUS | Status: DC
Start: 2019-09-12 — End: 2019-09-13

## 2019-09-13 MED ORDER — CVS KIDPANT BOYS X-LARGE MISC
40.00 | Status: DC
Start: 2019-09-12 — End: 2019-09-13

## 2019-09-13 MED ORDER — ISOSORBIDE MONONITRATE ER 30 MG PO TB24
30.00 | ORAL_TABLET | ORAL | Status: DC
Start: 2019-09-13 — End: 2019-09-13

## 2019-12-12 ENCOUNTER — Other Ambulatory Visit: Payer: Self-pay | Admitting: Physician Assistant

## 2019-12-12 DIAGNOSIS — I2510 Atherosclerotic heart disease of native coronary artery without angina pectoris: Secondary | ICD-10-CM

## 2020-10-28 DIAGNOSIS — H60391 Other infective otitis externa, right ear: Secondary | ICD-10-CM | POA: Diagnosis not present

## 2020-10-29 DIAGNOSIS — H6091 Unspecified otitis externa, right ear: Secondary | ICD-10-CM | POA: Diagnosis not present

## 2020-10-29 DIAGNOSIS — I10 Essential (primary) hypertension: Secondary | ICD-10-CM | POA: Diagnosis not present

## 2020-10-29 DIAGNOSIS — I2510 Atherosclerotic heart disease of native coronary artery without angina pectoris: Secondary | ICD-10-CM | POA: Diagnosis not present

## 2020-10-29 DIAGNOSIS — H9201 Otalgia, right ear: Secondary | ICD-10-CM | POA: Diagnosis not present

## 2020-10-29 DIAGNOSIS — Z794 Long term (current) use of insulin: Secondary | ICD-10-CM | POA: Diagnosis not present

## 2020-10-29 DIAGNOSIS — H60501 Unspecified acute noninfective otitis externa, right ear: Secondary | ICD-10-CM | POA: Diagnosis not present

## 2020-10-29 DIAGNOSIS — F32A Depression, unspecified: Secondary | ICD-10-CM | POA: Diagnosis not present

## 2020-10-29 DIAGNOSIS — E119 Type 2 diabetes mellitus without complications: Secondary | ICD-10-CM | POA: Diagnosis not present

## 2020-10-29 DIAGNOSIS — Z7982 Long term (current) use of aspirin: Secondary | ICD-10-CM | POA: Diagnosis not present

## 2021-02-12 DIAGNOSIS — Z23 Encounter for immunization: Secondary | ICD-10-CM | POA: Diagnosis not present

## 2021-02-27 DIAGNOSIS — Z23 Encounter for immunization: Secondary | ICD-10-CM | POA: Diagnosis not present

## 2021-11-11 ENCOUNTER — Ambulatory Visit (INDEPENDENT_AMBULATORY_CARE_PROVIDER_SITE_OTHER): Payer: Medicare Other | Admitting: Psychiatry

## 2021-11-11 ENCOUNTER — Encounter (HOSPITAL_COMMUNITY): Payer: Self-pay | Admitting: Psychiatry

## 2021-11-11 DIAGNOSIS — F0630 Mood disorder due to known physiological condition, unspecified: Secondary | ICD-10-CM

## 2021-11-11 DIAGNOSIS — F331 Major depressive disorder, recurrent, moderate: Secondary | ICD-10-CM

## 2021-11-11 DIAGNOSIS — F4310 Post-traumatic stress disorder, unspecified: Secondary | ICD-10-CM

## 2021-11-11 DIAGNOSIS — F411 Generalized anxiety disorder: Secondary | ICD-10-CM | POA: Diagnosis not present

## 2021-11-11 MED ORDER — LAMOTRIGINE 100 MG PO TABS: 150.0000 mg | ORAL_TABLET | Freq: Every day | ORAL | 0 refills | Status: DC

## 2021-11-11 MED ORDER — LAMOTRIGINE 100 MG PO TABS
150.0000 mg | ORAL_TABLET | Freq: Every day | ORAL | 0 refills | Status: DC
Start: 2021-11-11 — End: 2021-12-23
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
## 2021-12-12 DIAGNOSIS — F332 Major depressive disorder, recurrent severe without psychotic features: Secondary | ICD-10-CM | POA: Diagnosis not present

## 2021-12-12 DIAGNOSIS — I2510 Atherosclerotic heart disease of native coronary artery without angina pectoris: Secondary | ICD-10-CM | POA: Diagnosis not present

## 2021-12-12 DIAGNOSIS — R45851 Suicidal ideations: Secondary | ICD-10-CM | POA: Diagnosis not present

## 2021-12-12 DIAGNOSIS — E119 Type 2 diabetes mellitus without complications: Secondary | ICD-10-CM | POA: Diagnosis not present

## 2021-12-12 DIAGNOSIS — E782 Mixed hyperlipidemia: Secondary | ICD-10-CM | POA: Diagnosis not present

## 2021-12-12 DIAGNOSIS — Z794 Long term (current) use of insulin: Secondary | ICD-10-CM | POA: Diagnosis not present

## 2021-12-12 DIAGNOSIS — F32A Depression, unspecified: Secondary | ICD-10-CM | POA: Diagnosis not present

## 2021-12-12 DIAGNOSIS — I10 Essential (primary) hypertension: Secondary | ICD-10-CM | POA: Diagnosis not present

## 2021-12-12 DIAGNOSIS — I252 Old myocardial infarction: Secondary | ICD-10-CM | POA: Diagnosis not present

## 2021-12-13 DIAGNOSIS — R45851 Suicidal ideations: Secondary | ICD-10-CM | POA: Diagnosis not present

## 2021-12-13 DIAGNOSIS — F32A Depression, unspecified: Secondary | ICD-10-CM | POA: Diagnosis not present

## 2021-12-14 DIAGNOSIS — F332 Major depressive disorder, recurrent severe without psychotic features: Secondary | ICD-10-CM | POA: Diagnosis not present

## 2021-12-23 ENCOUNTER — Encounter (HOSPITAL_COMMUNITY): Payer: Self-pay | Admitting: Psychiatry

## 2021-12-23 ENCOUNTER — Ambulatory Visit (INDEPENDENT_AMBULATORY_CARE_PROVIDER_SITE_OTHER): Payer: No Typology Code available for payment source | Admitting: Psychiatry

## 2021-12-23 VITALS — BP 122/84 | HR 68 | Ht 68.0 in | Wt 147.0 lb

## 2021-12-23 DIAGNOSIS — F411 Generalized anxiety disorder: Secondary | ICD-10-CM

## 2021-12-23 DIAGNOSIS — F4310 Post-traumatic stress disorder, unspecified: Secondary | ICD-10-CM | POA: Diagnosis not present

## 2021-12-23 DIAGNOSIS — F319 Bipolar disorder, unspecified: Secondary | ICD-10-CM

## 2021-12-23 DIAGNOSIS — F39 Unspecified mood [affective] disorder: Secondary | ICD-10-CM

## 2021-12-23 MED ORDER — ESCITALOPRAM OXALATE 20 MG PO TABS: 20.0000 mg | ORAL_TABLET | Freq: Every day | ORAL | 1 refills | Status: DC

## 2021-12-23 MED ORDER — LAMOTRIGINE 100 MG PO TABS: 150.0000 mg | ORAL_TABLET | Freq: Two times a day (BID) | ORAL | 0 refills | Status: DC

## 2021-12-23 MED ORDER — BUPROPION HCL ER (XL) 150 MG PO TB24: ORAL_TABLET | ORAL | 1 refills | Status: DC
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
## 2021-12-30 ENCOUNTER — Ambulatory Visit (INDEPENDENT_AMBULATORY_CARE_PROVIDER_SITE_OTHER): Payer: No Typology Code available for payment source | Admitting: Licensed Clinical Social Worker

## 2021-12-30 DIAGNOSIS — F332 Major depressive disorder, recurrent severe without psychotic features: Secondary | ICD-10-CM

## 2021-12-30 DIAGNOSIS — F319 Bipolar disorder, unspecified: Secondary | ICD-10-CM | POA: Diagnosis not present

## 2021-12-30 DIAGNOSIS — F411 Generalized anxiety disorder: Secondary | ICD-10-CM | POA: Diagnosis not present

## 2021-12-30 DIAGNOSIS — F39 Unspecified mood [affective] disorder: Secondary | ICD-10-CM

## 2021-12-30 DIAGNOSIS — F4310 Post-traumatic stress disorder, unspecified: Secondary | ICD-10-CM
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
## 2022-01-29 ENCOUNTER — Ambulatory Visit (INDEPENDENT_AMBULATORY_CARE_PROVIDER_SITE_OTHER): Payer: Medicare Other | Admitting: Psychiatry

## 2022-01-29 ENCOUNTER — Ambulatory Visit (HOSPITAL_COMMUNITY): Payer: No Typology Code available for payment source | Admitting: Psychiatry

## 2022-01-29 ENCOUNTER — Encounter (HOSPITAL_COMMUNITY): Payer: Self-pay | Admitting: Psychiatry

## 2022-01-29 VITALS — BP 132/64 | Temp 98.6°F | Ht 68.0 in | Wt 148.0 lb

## 2022-01-29 DIAGNOSIS — F4310 Post-traumatic stress disorder, unspecified: Secondary | ICD-10-CM

## 2022-01-29 DIAGNOSIS — F0630 Mood disorder due to known physiological condition, unspecified: Secondary | ICD-10-CM

## 2022-01-29 DIAGNOSIS — F39 Unspecified mood [affective] disorder: Secondary | ICD-10-CM | POA: Diagnosis not present

## 2022-01-29 DIAGNOSIS — F411 Generalized anxiety disorder: Secondary | ICD-10-CM | POA: Diagnosis not present

## 2022-01-29 MED ORDER — LAMOTRIGINE 100 MG PO TABS: 100.0000 mg | ORAL_TABLET | Freq: Two times a day (BID) | ORAL | 1 refills | Status: DC

## 2022-01-29 MED ORDER — BUPROPION HCL ER (XL) 150 MG PO TB24: ORAL_TABLET | ORAL | 1 refills | Status: DC

## 2022-01-29 MED ORDER — ESCITALOPRAM OXALATE 20 MG PO TABS
20.0000 mg | ORAL_TABLET | Freq: Every day | ORAL | 1 refills | Status: DC
Start: 2022-01-29 — End: 2022-03-31
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
## 2022-02-08 DIAGNOSIS — K0889 Other specified disorders of teeth and supporting structures: Secondary | ICD-10-CM | POA: Diagnosis not present

## 2022-02-15 DIAGNOSIS — R6884 Jaw pain: Secondary | ICD-10-CM | POA: Diagnosis not present

## 2022-02-15 DIAGNOSIS — Z7982 Long term (current) use of aspirin: Secondary | ICD-10-CM | POA: Diagnosis not present

## 2022-02-15 DIAGNOSIS — Z951 Presence of aortocoronary bypass graft: Secondary | ICD-10-CM | POA: Diagnosis not present

## 2022-02-15 DIAGNOSIS — E119 Type 2 diabetes mellitus without complications: Secondary | ICD-10-CM | POA: Diagnosis not present

## 2022-02-15 DIAGNOSIS — I10 Essential (primary) hypertension: Secondary | ICD-10-CM | POA: Diagnosis not present

## 2022-02-15 DIAGNOSIS — Z955 Presence of coronary angioplasty implant and graft: Secondary | ICD-10-CM | POA: Diagnosis not present

## 2022-02-15 DIAGNOSIS — Z794 Long term (current) use of insulin: Secondary | ICD-10-CM | POA: Diagnosis not present

## 2022-02-15 DIAGNOSIS — Z7902 Long term (current) use of antithrombotics/antiplatelets: Secondary | ICD-10-CM | POA: Diagnosis not present

## 2022-02-15 DIAGNOSIS — I252 Old myocardial infarction: Secondary | ICD-10-CM | POA: Diagnosis not present

## 2022-02-15 DIAGNOSIS — F32A Depression, unspecified: Secondary | ICD-10-CM | POA: Diagnosis not present

## 2022-02-15 DIAGNOSIS — K047 Periapical abscess without sinus: Secondary | ICD-10-CM | POA: Diagnosis not present

## 2022-02-15 DIAGNOSIS — Z7984 Long term (current) use of oral hypoglycemic drugs: Secondary | ICD-10-CM | POA: Diagnosis not present

## 2022-02-15 DIAGNOSIS — Z79899 Other long term (current) drug therapy: Secondary | ICD-10-CM | POA: Diagnosis not present

## 2022-02-15 DIAGNOSIS — K0889 Other specified disorders of teeth and supporting structures: Secondary | ICD-10-CM | POA: Diagnosis not present

## 2022-02-15 DIAGNOSIS — I2510 Atherosclerotic heart disease of native coronary artery without angina pectoris: Secondary | ICD-10-CM | POA: Diagnosis not present

## 2022-02-17 DIAGNOSIS — Z23 Encounter for immunization: Secondary | ICD-10-CM | POA: Diagnosis not present

## 2022-02-19 DIAGNOSIS — I10 Essential (primary) hypertension: Secondary | ICD-10-CM | POA: Diagnosis not present

## 2022-02-19 DIAGNOSIS — I2510 Atherosclerotic heart disease of native coronary artery without angina pectoris: Secondary | ICD-10-CM | POA: Diagnosis not present

## 2022-02-19 DIAGNOSIS — F411 Generalized anxiety disorder: Secondary | ICD-10-CM | POA: Diagnosis not present

## 2022-02-19 DIAGNOSIS — R001 Bradycardia, unspecified: Secondary | ICD-10-CM | POA: Diagnosis not present

## 2022-02-19 DIAGNOSIS — R29818 Other symptoms and signs involving the nervous system: Secondary | ICD-10-CM | POA: Diagnosis not present

## 2022-02-19 DIAGNOSIS — I252 Old myocardial infarction: Secondary | ICD-10-CM | POA: Diagnosis not present

## 2022-03-02 ENCOUNTER — Ambulatory Visit (HOSPITAL_COMMUNITY): Payer: No Typology Code available for payment source | Admitting: Licensed Clinical Social Worker

## 2022-03-31 ENCOUNTER — Encounter (HOSPITAL_COMMUNITY): Payer: Self-pay | Admitting: Psychiatry

## 2022-03-31 ENCOUNTER — Ambulatory Visit (INDEPENDENT_AMBULATORY_CARE_PROVIDER_SITE_OTHER): Payer: Medicare Other | Admitting: Psychiatry

## 2022-03-31 VITALS — BP 145/75 | HR 69 | Ht 68.0 in | Wt 144.0 lb

## 2022-03-31 DIAGNOSIS — F39 Unspecified mood [affective] disorder: Secondary | ICD-10-CM

## 2022-03-31 DIAGNOSIS — F332 Major depressive disorder, recurrent severe without psychotic features: Secondary | ICD-10-CM

## 2022-03-31 DIAGNOSIS — F411 Generalized anxiety disorder: Secondary | ICD-10-CM

## 2022-03-31 DIAGNOSIS — F4310 Post-traumatic stress disorder, unspecified: Secondary | ICD-10-CM

## 2022-03-31 DIAGNOSIS — F319 Bipolar disorder, unspecified: Secondary | ICD-10-CM

## 2022-03-31 MED ORDER — LAMOTRIGINE 100 MG PO TABS
100.0000 mg | ORAL_TABLET | Freq: Two times a day (BID) | ORAL | 1 refills | Status: DC
Start: 2022-03-31 — End: 2022-05-07

## 2022-03-31 MED ORDER — ESCITALOPRAM OXALATE 20 MG PO TABS: 20.0000 mg | ORAL_TABLET | Freq: Every day | ORAL | 1 refills | Status: DC

## 2022-03-31 MED ORDER — BUPROPION HCL ER (XL) 300 MG PO TB24: ORAL_TABLET | ORAL | 0 refills | Status: DC

## 2022-03-31 NOTE — Progress Notes (Signed)
[facility]  Follow up visit  Patient Identification: Mark Maynard. MRN:  [id number] Date of Evaluation:  03/31/2022 Referral Source: SELF, PRIMARY CARE, [facility] Chief Complaint:   Chief Complaint  Patient presents with   Follow-up   Follow up depression Visit Diagnosis:    ICD-10-CM   1. PTSD (post-traumatic stress disorder)  F43.10     2. Mood disorder (HCC)  F39 escitalopram (LEXAPRO) 20 MG tablet    3. GAD (generalized anxiety disorder)  F41.1     4. Severe episode of recurrent major depressive disorder, without psychotic features (HCC)  F33.2     5. Bipolar I disorder (HCC)  F31.9       History of Present Illness:  Patient is a 66 years old currently single Caucasian male initially referred himself and the [facility] for establishing care for possible mood symptoms, PTSD.  And depression. He is currently taking to retirement someone from [employer] and 1 from [employer]   Was doing fair but last couple of weeks withdrawn, stayed home, feeling subdued  Has therapy appointment tomorrow with [employer] energy is low and lonliness as factor for depressin    Past triggers upsets him but he is trying to distract  Aggravating factors:trauma when young,xcolleagues died in blast in [place]  Modifying factor: retirement  Duration : more then 5 years  Severity :subdued  Past psych admission : denies  Denis suicide attempt   Past Psychiatric History: depression, anxiety, trauma history  Previous Psychotropic Medications: Yes   Substance Abuse History in the last 12 months:  No.  Consequences of Substance Abuse: NA  Past Medical History:  Past Medical History:  Diagnosis Date   Anxiety    CAD (coronary artery disease)    native 3-vessel disease   Choledocholithiasis    Depression    Diabetes mellitus without complication (HCC)    Hyperlipidemia    Hypertension    Myocardial infarction (HCC) 2012   x 3, s/p CABG, 9 stents   Right middle lobe pneumonia  11/09/2017   Right upper lobe pulmonary nodule 11/09/2017    Past Surgical History:  Procedure Laterality Date   CORONARY ARTERY BYPASS GRAFT  2012   CORONARY ARTERY BYPASS GRAFT     EYE SURGERY  1980's   ptyrgium removal   PERCUTANEOUS CORONARY STENT INTERVENTION (PCI-S)  06/2017    Family Psychiatric History: denies  Family History:  Family History  Problem Relation Age of Onset   Melanoma Father     Social History:   Social History   Socioeconomic History   Marital status: Divorced    Spouse name: Not on file   Number of children: Not on file   Years of education: Not on file   Highest education level: Not on file  Occupational History   Not on file  Tobacco Use   Smoking status: Never   Smokeless tobacco: Never  Vaping Use   Vaping Use: Never used  Substance and Sexual Activity   Alcohol use: Yes    Alcohol/week: 1.0 standard drink of alcohol    Types: 1 Cans of beer per week   Drug use: No   Sexual activity: Yes    Birth control/protection: None  Other Topics Concern   Not on file  Social History Narrative   Not on file   Social Determinants of Health   Financial Resource Strain: Not on file  Food Insecurity: Not on file  Transportation Needs: Not on file  Physical  Activity: Not on file  Stress: Not on file  Social Connections: Not on file    Allergies:  No Known Allergies  Metabolic Disorder Labs: Lab Results  Component Value Date   HGBA1C 6.2 03/31/2018   MPG 154 (H) 06/19/2015   No results found for: "PROLACTIN" Lab Results  Component Value Date   CHOL 181 04/28/2016   TRIG 189 (H) 04/28/2016   HDL 47 04/28/2016   CHOLHDL 3.9 04/28/2016   VLDL 38 (H) 04/28/2016   LDLCALC 96 04/28/2016   No results found for: "TSH"  Therapeutic Level Labs: No results found for: "LITHIUM" No results found for: "CBMZ" No results found for: "VALPROATE"  Current Medications: Current Outpatient Medications  Medication Sig Dispense Refill    AMBULATORY NON FORMULARY MEDICATION Single glucometer with lancets, test strips.   Check fasting morning blood sugar QD. Please bill with insurance. 1 each 0   aspirin EC 81 MG tablet Take 1 tablet (81 mg total) by mouth daily. 90 tablet 3   atenolol (TENORMIN) 25 MG tablet Take 0.5 tablets (12.5 mg total) by mouth daily.     atorvastatin (LIPITOR) 80 MG tablet Take 1 tablet (80 mg total) by mouth daily. (Patient taking differently: Take 40 mg by mouth daily.) 90 tablet 3   atorvastatin (LIPITOR) 80 MG tablet Take by mouth.     carvedilol (COREG) 25 MG tablet Take 1 tablet by mouth 2 (two) times daily.     clopidogrel (PLAVIX) 75 MG tablet Take 1 tablet (75 mg total) by mouth daily. 90 tablet 3   Dulaglutide (TRULICITY) 1.5 PY/1.9JK SOPN Inject 1.5 mg into the skin once a week. 4 pen 11   empagliflozin (JARDIANCE) 25 MG TABS tablet Take 0.5 tablets by mouth daily.     isosorbide mononitrate (IMDUR) 30 MG 24 hr tablet Take 1 tablet (30 mg total) by mouth daily. 90 tablet 3   metFORMIN (GLUCOPHAGE-XR) 500 MG 24 hr tablet Take 1 tablet (500 mg total) by mouth 2 (two) times daily. 60 tablet 3   Multiple Vitamin (MULTIVITAMIN) tablet Take by mouth.     nitroGLYCERIN (NITROSTAT) 0.4 MG SL tablet Place 1 tablet (0.4 mg total) under the tongue every 5 (five) minutes as needed for chest pain (or tightness). 30 tablet 0   pantoprazole (PROTONIX) 20 MG tablet TAKE 1 TABLET BY MOUTH ONCE DAILY 90 tablet 1   buPROPion (WELLBUTRIN XL) 300 MG 24 hr tablet TAKE ONE TABLET BY MOUTH IN THE MORNING. Now is 300mg  30 tablet 0   escitalopram (LEXAPRO) 20 MG tablet Take 1 tablet (20 mg total) by mouth at bedtime. 30 tablet 1   lamoTRIgine (LAMICTAL) 100 MG tablet Take 1 tablet (100 mg total) by mouth 2 (two) times daily. 60 tablet 1   No current facility-administered medications for this visit.    Psychiatric Specialty Exam: Review of Systems  Cardiovascular:  Negative for chest pain.  Skin:  Negative for rash.   Neurological:  Negative for tremors.  Psychiatric/Behavioral:  Positive for dysphoric mood. Negative for self-injury.     Blood pressure (!) 145/75, pulse 69, height 5\' 8"  (1.727 m), weight 144 lb (65.3 kg).Body mass index is 21.9 kg/m.  General Appearance: Casual  Eye Contact:  Good  Speech:  Clear and Coherent  Volume:  Normal  Mood: subdued  Affect:  Congruent  Thought Process:  Goal Directed  Orientation:  Full (Time, Place, and Person)  Thought Content:  rumination   Suicidal Thoughts:  No  Homicidal  Thoughts:  No  Memory:  Immediate;   Fair  Judgement:  Fair  Insight:  Shallow  Psychomotor Activity:  Normal  Concentration:  Concentration: Fair  Recall:  [employer] of Knowledge:Good  Language: Good  Akathisia:  No  Handed:    AIMS (if indicated):  not done  Assets:  Desire for Improvement Housing Leisure Time  ADL's:  Intact  Cognition: WNL  Sleep:   irregular   Screenings: GAD-7    Flowsheet Row Office Visit from 03/31/2018 in [facility] Office Visit from 12/27/2017 in [facility] AT [place] Office Visit from 09/13/2017 in [facility]  Total GAD-7 Score 19 16 12       Mini-Mental    Flowsheet Row Office Visit from 11/08/2017 in [facility] Office Visit from 09/13/2017 in [facility] At [facility]  Total Score (max 30 points ) 29 27      PHQ2-9    Flowsheet Row Counselor from 12/30/2021 in BEHAVIORAL [facility]ALTH OUTPATIENT CENTER AT [place] Office Visit from 11/11/2021 in BEHAVIORAL HEALTH OUTPATIENT CENTER AT [place] Office Visit from 03/31/2018 in [facility] At [facility] Office Visit from 12/27/2017 in BEHAVIORAL HEALTH OUTPATIENT CENTER AT [place] Office Visit from 09/13/2017 in [facility]  PHQ-2 Total Score 6 3 6 2 6   PHQ-9 Total Score 24  14 23 14 24       Flowsheet Row Office Visit from 03/31/2022 in BEHAVIORAL HEALTH OUTPATIENT CENTER AT [place] Office Visit from 01/29/2022 in BEHAVIORAL HEALTH OUTPATIENT CENTER AT [place] Counselor from 12/30/2021 in BEHAVIORAL HEALTH OUTPATIENT CENTER AT [place]  C-SSRS RISK CATEGORY No Risk Error: Q3, 4, or 5 should not be populated when Q2 is No Error: Q2 is Yes, you must answer 3, 4, and 5       Assessment and Plan: as follows  Prior documentation reviewed  Mood disorder unspecified: possilbe rule out bipolar/ depressed episoder;feeling subdued, will increase wellbutrin to 300mg  Continue lamictal, discussed to restart gym or some daily chore outside home, continue therapy says he is connecting with therapist and has appointment tomorrow   Continue gym and meds, reviewed   GAD: fluctuates, continue lexapro and therapy   PTSD:  baseline, continue lexapro and therapy to work on distractions  Fu 1- 78m   Direct care time spent in office 20 min including chart review and face to face 11/7/202310:40 AM

## 2022-05-07 ENCOUNTER — Encounter (HOSPITAL_COMMUNITY): Payer: Self-pay | Admitting: Psychiatry

## 2022-05-07 ENCOUNTER — Ambulatory Visit (INDEPENDENT_AMBULATORY_CARE_PROVIDER_SITE_OTHER): Payer: Medicare Other | Admitting: Psychiatry

## 2022-05-07 VITALS — BP 144/74 | HR 78 | Ht 63.0 in | Wt 143.0 lb

## 2022-05-07 DIAGNOSIS — F411 Generalized anxiety disorder: Secondary | ICD-10-CM | POA: Diagnosis not present

## 2022-05-07 DIAGNOSIS — F332 Major depressive disorder, recurrent severe without psychotic features: Secondary | ICD-10-CM | POA: Diagnosis not present

## 2022-05-07 DIAGNOSIS — F319 Bipolar disorder, unspecified: Secondary | ICD-10-CM | POA: Diagnosis not present

## 2022-05-07 DIAGNOSIS — F39 Unspecified mood [affective] disorder: Secondary | ICD-10-CM | POA: Diagnosis not present

## 2022-05-07 DIAGNOSIS — F4310 Post-traumatic stress disorder, unspecified: Secondary | ICD-10-CM | POA: Diagnosis not present

## 2022-05-07 MED ORDER — LAMOTRIGINE 100 MG PO TABS: 100.0000 mg | ORAL_TABLET | Freq: Two times a day (BID) | ORAL | 1 refills | Status: AC

## 2022-05-07 MED ORDER — BUPROPION HCL ER (XL) 300 MG PO TB24: ORAL_TABLET | ORAL | 1 refills | Status: AC

## 2022-05-07 MED ORDER — ESCITALOPRAM OXALATE 20 MG PO TABS: 20.0000 mg | ORAL_TABLET | Freq: Every day | ORAL | 1 refills | Status: AC

## 2022-05-07 NOTE — Progress Notes (Signed)
[place]  Follow up visit  Patient Identification: Mark Maynard. MRN:  [id number] Date of Evaluation:  05/07/2022 Referral Source: SELF, [place], [place] Chief Complaint:   Chief Complaint  Patient presents with   Follow-up   Follow up depression Visit Diagnosis:    ICD-10-CM   1. Severe episode of recurrent major depressive disorder, without psychotic features ([place])  F33.2     2. GAD (generalized anxiety disorder)  F41.1     3. PTSD (post-traumatic stress disorder)  F43.10     4. Bipolar I disorder ([place])  F31.9     5. Mood disorder (HCC)  F39 escitalopram (LEXAPRO) 20 MG tablet      History of Present Illness:  Patient is a 66 years old currently single Caucasian male initially referred himself and the [facility] for establishing care for possible mood symptoms, PTSD.  And depression. He is currently taking to retirement someone from [employer] and 1 from [place] visit increased wellbutrin to 300mg  he did well but recent week feel getting subdued again, states it may be the weather and holiday season Tries to work on [place] including going to gym but has been holding off [facility]  Feels lonely and says mind start thinking or worrying Tries to distract  Says not need to increase med but he plans to start working on the tools to distract and go back to plane fitness Says had a panic attack a month ago, called for help and it got better, he understands not to drive at night or long distances in case he is having anxiety   Past triggers upsets him but he is trying to distract  Aggravating factors:trauma when young,xcolleagues died in blast in [place]  Modifying factor: retirement  Duration : more then 5 years  Severity : gets subdued  Past psych admission : denies  Denis suicide attempt   Past Psychiatric History: depression, anxiety, trauma history  Previous Psychotropic Medications: Yes   Substance Abuse  History in the last 12 months:  No.  Consequences of Substance Abuse: NA  Past Medical History:  Past Medical History:  Diagnosis Date   Anxiety    CAD (coronary artery disease)    native 3-vessel disease   Choledocholithiasis    Depression    Diabetes mellitus without complication ([place])    Hyperlipidemia    Hypertension    Myocardial infarction ([place]) 2012   x 3, s/p CABG, 9 stents   Right middle lobe pneumonia 11/09/2017   Right upper lobe pulmonary nodule 11/09/2017    Past Surgical History:  Procedure Laterality Date   CORONARY ARTERY BYPASS GRAFT  2012   CORONARY ARTERY BYPASS GRAFT     EYE SURGERY  1980's   ptyrgium removal   PERCUTANEOUS CORONARY STENT INTERVENTION (PCI-S)  06/2017    Family Psychiatric History: denies  Family History:  Family History  Problem Relation Age of Onset   Melanoma Father     Social History:   Social History   Socioeconomic History   Marital status: Divorced    Spouse name: Not on file   Number of children: Not on file   Years of education: Not on file   Highest education level: Not on file  Occupational History   Not on file  Tobacco Use   Smoking status: Never   Smokeless tobacco: Never  Vaping Use   Vaping Use: Never used  Substance and Sexual  Activity   Alcohol use: Yes    Alcohol/week: 1.0 standard drink of alcohol    Types: 1 Cans of beer per week   Drug use: No   Sexual activity: Yes    Birth control/protection: None  Other Topics Concern   Not on file  Social History Narrative   Not on file   Social Determinants of Health   Financial Resource Strain: Not on file  Food Insecurity: Not on file  Transportation Needs: Not on file  Physical Activity: Not on file  Stress: Not on file  Social Connections: Not on file    Allergies:  No Known Allergies  Metabolic Disorder Labs: Lab Results  Component Value Date   HGBA1C 6.2 03/31/2018   MPG 154 (H) 06/19/2015   No results found for: "PROLACTIN" Lab  Results  Component Value Date   CHOL 181 04/28/2016   TRIG 189 (H) 04/28/2016   HDL 47 04/28/2016   CHOLHDL 3.9 04/28/2016   VLDL 38 (H) 04/28/2016   LDLCALC 96 04/28/2016   No results found for: "TSH"  Therapeutic Level Labs: No results found for: "LITHIUM" No results found for: "CBMZ" No results found for: "VALPROATE"  Current Medications: Current Outpatient Medications  Medication Sig Dispense Refill   AMBULATORY NON FORMULARY MEDICATION Single glucometer with lancets, test strips.   Check fasting morning blood sugar QD. Please bill with insurance. 1 each 0   aspirin EC 81 MG tablet Take 1 tablet (81 mg total) by mouth daily. 90 tablet 3   atenolol (TENORMIN) 25 MG tablet Take 0.5 tablets (12.5 mg total) by mouth daily.     atorvastatin (LIPITOR) 80 MG tablet Take 1 tablet (80 mg total) by mouth daily. (Patient taking differently: Take 40 mg by mouth daily.) 90 tablet 3   atorvastatin (LIPITOR) 80 MG tablet Take by mouth.     carvedilol (COREG) 25 MG tablet Take 1 tablet by mouth 2 (two) times daily.     clopidogrel (PLAVIX) 75 MG tablet Take 1 tablet (75 mg total) by mouth daily. 90 tablet 3   Dulaglutide (TRULICITY) 1.5 0000000 SOPN Inject 1.5 mg into the skin once a week. 4 pen 11   empagliflozin (JARDIANCE) 25 MG TABS tablet Take 0.5 tablets by mouth daily.     isosorbide mononitrate (IMDUR) 30 MG 24 hr tablet Take 1 tablet (30 mg total) by mouth daily. 90 tablet 3   metFORMIN (GLUCOPHAGE-XR) 500 MG 24 hr tablet Take 1 tablet (500 mg total) by mouth 2 (two) times daily. 60 tablet 3   Multiple Vitamin (MULTIVITAMIN) tablet Take by mouth.     nitroGLYCERIN (NITROSTAT) 0.4 MG SL tablet Place 1 tablet (0.4 mg total) under the tongue every 5 (five) minutes as needed for chest pain (or tightness). 30 tablet 0   pantoprazole (PROTONIX) 20 MG tablet TAKE 1 TABLET BY MOUTH ONCE DAILY 90 tablet 1   buPROPion (WELLBUTRIN XL) 300 MG 24 hr tablet TAKE ONE TABLET BY MOUTH IN THE MORNING.  Now is 300mg  30 tablet 1   escitalopram (LEXAPRO) 20 MG tablet Take 1 tablet (20 mg total) by mouth at bedtime. 30 tablet 1   lamoTRIgine (LAMICTAL) 100 MG tablet Take 1 tablet (100 mg total) by mouth 2 (two) times daily. 60 tablet 1   No current facility-administered medications for this visit.    Psychiatric Specialty Exam: Review of Systems  Cardiovascular:  Negative for chest pain.  Skin:  Negative for rash.  Neurological:  Negative for tremors.  Psychiatric/Behavioral:  Positive for dysphoric mood. Negative for self-injury.     Blood pressure (!) 144/74, pulse 78, height 5\' 3"  (1.6 m), weight 143 lb (64.9 kg).Body mass index is 25.33 kg/m.  General Appearance: Casual  Eye Contact:  Good  Speech:  Clear and Coherent  Volume:  Normal  Mood: somewhat subdued  Affect:  Congruent  Thought Process:  Goal Directed  Orientation:  Full (Time, Place, and Person)  Thought Content:  rumination   Suicidal Thoughts:  No  Homicidal Thoughts:  No  Memory:  Immediate;   Fair  Judgement:  Fair  Insight:  Shallow  Psychomotor Activity:  Normal  Concentration:  Concentration: Fair  Recall:  of Knowledge:Good  Language: Good  Akathisia:  No  Handed:    AIMS (if indicated):  not done  Assets:  Desire for Improvement Housing Leisure Time  ADL's:  Intact  Cognition: WNL  Sleep:   irregular   Screenings: GAD-7    Flowsheet Row Office Visit from 03/31/2018 in [facility] Office Visit from 12/27/2017 in BEHAVIORAL HEALTH OUTPATIENT CENTER AT [place] Office Visit from 09/13/2017 in [facility]  Total GAD-7 Score 19 16 12       Mini-Mental    Flowsheet Row Office Visit from 11/08/2017 in [facility] At [facility] Office Visit from 09/13/2017 in [facility] At [facility]  Total Score (max 30 points ) 29 27      PHQ2-9    Flowsheet Row Counselor from  12/30/2021 in BEHAVIORAL HEALTH OUTPATIENT CENTER AT [place] Office Visit from 11/11/2021 in BE[facility]AV[facility] HEALTH OUTPATIENT CENTER AT [place] Office Visit from 03/31/2018 in [facility] At [facility] Office Visit from 12/27/2017 in BEHAVIORAL HEALTH OUTPATIENT CENTER AT [place] Office Visit from 09/13/2017 in [facility]  PHQ-2 Total Score 6 3 6 2 6   PHQ-9 Total Score 24 14 23 14 24       Flowsheet Row Office Visit from 03/31/2022 in BEHAVIORAL HEALTH OUTPATIENT CENTER AT [place] Office Visit from 01/29/2022 in BEHAVIORAL HEALTH OUTPATIENT CENTER AT [place] Counselor from 12/30/2021 in BEHAVIORAL HEALTH OUTPATIENT CENTER AT [place]  C-SSRS RISK CATEGORY No Risk Error: Q3, 4, or 5 should not be populated when Q2 is No Error: Q2 is Yes, you must answer 3, 4, and 5       Assessment and Plan: as follows Prior documentation reviewed  Mood disorder unspecified: possilbe rule out bipolar/ depressed episoder; feels subdued but says med were working and plans to remain compliant, he plans to work on the tools to distract and get busier again,also in therapy, he does not want to increase med Not suicidal   Continue gym and meds, reviewed   GAD: fluctuates, continue lexapro    PTSD:  baseline, continue lexapro and work on the tools to keep busy    Direct care time spent in office 20 plus min includng face to face time He wants to fu in 68months says will continue therapy and call earlier for concerns and understands to call 911 or helpline if have to

## 2022-06-06 DIAGNOSIS — I252 Old myocardial infarction: Secondary | ICD-10-CM | POA: Diagnosis not present

## 2022-06-06 DIAGNOSIS — I2510 Atherosclerotic heart disease of native coronary artery without angina pectoris: Secondary | ICD-10-CM | POA: Diagnosis not present

## 2022-06-06 DIAGNOSIS — Z7902 Long term (current) use of antithrombotics/antiplatelets: Secondary | ICD-10-CM | POA: Diagnosis not present

## 2022-06-06 DIAGNOSIS — Z7984 Long term (current) use of oral hypoglycemic drugs: Secondary | ICD-10-CM | POA: Diagnosis not present

## 2022-06-06 DIAGNOSIS — U071 COVID-19: Secondary | ICD-10-CM | POA: Diagnosis not present

## 2022-06-06 DIAGNOSIS — J3489 Other specified disorders of nose and nasal sinuses: Secondary | ICD-10-CM | POA: Diagnosis not present

## 2022-06-06 DIAGNOSIS — I10 Essential (primary) hypertension: Secondary | ICD-10-CM | POA: Diagnosis not present

## 2022-06-06 DIAGNOSIS — E119 Type 2 diabetes mellitus without complications: Secondary | ICD-10-CM | POA: Diagnosis not present

## 2022-06-06 DIAGNOSIS — Z9861 Coronary angioplasty status: Secondary | ICD-10-CM | POA: Diagnosis not present

## 2022-06-06 DIAGNOSIS — Z7982 Long term (current) use of aspirin: Secondary | ICD-10-CM | POA: Diagnosis not present

## 2022-06-06 DIAGNOSIS — Z79899 Other long term (current) drug therapy: Secondary | ICD-10-CM | POA: Diagnosis not present

## 2022-06-06 DIAGNOSIS — U099 Post covid-19 condition, unspecified: Secondary | ICD-10-CM | POA: Diagnosis not present

## 2022-06-06 DIAGNOSIS — Z951 Presence of aortocoronary bypass graft: Secondary | ICD-10-CM | POA: Diagnosis not present

## 2022-06-07 ENCOUNTER — Encounter (HOSPITAL_BASED_OUTPATIENT_CLINIC_OR_DEPARTMENT_OTHER): Payer: Self-pay | Admitting: Emergency Medicine

## 2022-06-07 ENCOUNTER — Other Ambulatory Visit: Payer: Self-pay

## 2022-06-07 ENCOUNTER — Encounter (HOSPITAL_COMMUNITY): Payer: Self-pay

## 2022-06-07 ENCOUNTER — Emergency Department (HOSPITAL_BASED_OUTPATIENT_CLINIC_OR_DEPARTMENT_OTHER): Payer: Medicare Other

## 2022-06-07 ENCOUNTER — Observation Stay (HOSPITAL_BASED_OUTPATIENT_CLINIC_OR_DEPARTMENT_OTHER)
Admission: EM | Admit: 2022-06-07 | Discharge: 2022-06-09 | Disposition: A | Discharge status: Home / Self Care | Payer: Medicare Other | Attending: Internal Medicine | Admitting: Internal Medicine

## 2022-06-07 DIAGNOSIS — I10 Essential (primary) hypertension: Secondary | ICD-10-CM | POA: Diagnosis not present

## 2022-06-07 DIAGNOSIS — R079 Chest pain, unspecified: Secondary | ICD-10-CM | POA: Diagnosis not present

## 2022-06-07 DIAGNOSIS — Z9861 Coronary angioplasty status: Secondary | ICD-10-CM

## 2022-06-07 DIAGNOSIS — E119 Type 2 diabetes mellitus without complications: Secondary | ICD-10-CM | POA: Diagnosis not present

## 2022-06-07 DIAGNOSIS — Z7984 Long term (current) use of oral hypoglycemic drugs: Secondary | ICD-10-CM | POA: Diagnosis not present

## 2022-06-07 DIAGNOSIS — Z955 Presence of coronary angioplasty implant and graft: Secondary | ICD-10-CM | POA: Diagnosis not present

## 2022-06-07 DIAGNOSIS — Z79899 Other long term (current) drug therapy: Secondary | ICD-10-CM | POA: Diagnosis not present

## 2022-06-07 DIAGNOSIS — R0789 Other chest pain: Principal | ICD-10-CM | POA: Insufficient documentation

## 2022-06-07 DIAGNOSIS — I25110 Atherosclerotic heart disease of native coronary artery with unstable angina pectoris: Secondary | ICD-10-CM | POA: Insufficient documentation

## 2022-06-07 DIAGNOSIS — I2089 Other forms of angina pectoris: Secondary | ICD-10-CM

## 2022-06-07 DIAGNOSIS — Z7982 Long term (current) use of aspirin: Secondary | ICD-10-CM | POA: Diagnosis not present

## 2022-06-07 DIAGNOSIS — I209 Angina pectoris, unspecified: Secondary | ICD-10-CM | POA: Diagnosis not present

## 2022-06-07 DIAGNOSIS — Z951 Presence of aortocoronary bypass graft: Secondary | ICD-10-CM | POA: Insufficient documentation

## 2022-06-07 DIAGNOSIS — I200 Unstable angina: Secondary | ICD-10-CM

## 2022-06-07 DIAGNOSIS — Z7902 Long term (current) use of antithrombotics/antiplatelets: Secondary | ICD-10-CM | POA: Diagnosis not present

## 2022-06-07 DIAGNOSIS — R0600 Dyspnea, unspecified: Secondary | ICD-10-CM | POA: Diagnosis not present

## 2022-06-07 LAB — BASIC METABOLIC PANEL
Anion gap: 8 (ref 5–15)
BUN: 19 mg/dL (ref 8–23)
CO2: 25 mmol/L (ref 22–32)
Calcium: 8.4 mg/dL — ABNORMAL LOW (ref 8.9–10.3)
Chloride: 100 mmol/L (ref 98–111)
Creatinine, Ser: 0.92 mg/dL (ref 0.61–1.24)
GFR, Estimated: >60 mL/min (ref >60)
Glucose, Bld: 161 mg/dL — ABNORMAL HIGH (ref 70–99)
Potassium: 4.2 mmol/L (ref 3.5–5.1)
Sodium: 133 mmol/L — ABNORMAL LOW (ref 135–145)

## 2022-06-07 LAB — CBG MONITORING, ED: Glucose-Capillary: 127 mg/dL — ABNORMAL HIGH (ref 70–99)

## 2022-06-07 LAB — CBC
HCT: 41 % (ref 39.0–52.0)
Hemoglobin: 13.9 g/dL (ref 13.0–17.0)
MCH: 31.2 pg (ref 26.0–34.0)
MCHC: 33.9 g/dL (ref 30.0–36.0)
MCV: 91.9 fL (ref 80.0–100.0)
Platelets: 242 10*3/uL (ref 150–400)
RBC: 4.46 MIL/uL (ref 4.22–5.81)
RDW: 12.5 % (ref 11.5–15.5)
WBC: 5.9 10*3/uL (ref 4.0–10.5)
nRBC: 0 % (ref 0.0–0.2)

## 2022-06-07 LAB — TROPONIN I (HIGH SENSITIVITY)
Troponin I (High Sensitivity): 5 ng/L (ref <18)
Troponin I (High Sensitivity): 6 ng/L (ref <18)

## 2022-06-07 MED ORDER — ATENOLOL 25 MG PO TABS
12.5000 mg | ORAL_TABLET | Freq: Every day | ORAL | Status: DC
  Filled 2022-06-07: qty 1

## 2022-06-07 MED ORDER — PANTOPRAZOLE SODIUM 20 MG PO TBEC
20.0000 mg | DELAYED_RELEASE_TABLET | Freq: Every day | ORAL | Status: DC
  Administered 2022-06-09: 20 mg via ORAL
  Filled 2022-06-07 (×2): qty 1

## 2022-06-07 MED ORDER — ATORVASTATIN CALCIUM 40 MG PO TABS
40.0000 mg | ORAL_TABLET | Freq: Every day | ORAL | Status: DC
  Administered 2022-06-07 – 2022-06-08 (×2): 40 mg via ORAL
  Filled 2022-06-07 (×2): qty 1

## 2022-06-07 MED ORDER — ASPIRIN 81 MG PO TBEC
81.0000 mg | DELAYED_RELEASE_TABLET | Freq: Every day | ORAL | Status: DC
  Administered 2022-06-08: 81 mg via ORAL
  Filled 2022-06-07: qty 1

## 2022-06-07 MED ORDER — INSULIN ASPART 100 UNIT/ML IJ SOLN: 0.0000 [IU] | Freq: Every day | INTRAMUSCULAR | Status: DC

## 2022-06-07 MED ORDER — ASPIRIN 81 MG PO CHEW
324.0000 mg | CHEWABLE_TABLET | Freq: Once | ORAL | Status: AC
  Administered 2022-06-07: 324 mg via ORAL
  Filled 2022-06-07: qty 4

## 2022-06-07 MED ORDER — CARVEDILOL 25 MG PO TABS
25.0000 mg | ORAL_TABLET | Freq: Two times a day (BID) | ORAL | Status: DC
  Administered 2022-06-07 – 2022-06-09 (×4): 25 mg via ORAL
  Filled 2022-06-07: qty 1
  Filled 2022-06-07: qty 2
  Filled 2022-06-07: qty 1
  Filled 2022-06-07: qty 2

## 2022-06-07 MED ORDER — INSULIN ASPART 100 UNIT/ML IJ SOLN
0.0000 [IU] | Freq: Three times a day (TID) | INTRAMUSCULAR | Status: DC
  Administered 2022-06-08: 5 [IU] via SUBCUTANEOUS
  Administered 2022-06-09: 1 [IU] via SUBCUTANEOUS

## 2022-06-07 MED ORDER — LORAZEPAM 1 MG PO TABS
1.0000 mg | ORAL_TABLET | Freq: Once | ORAL | Status: AC
  Administered 2022-06-07: 1 mg via ORAL
  Filled 2022-06-07: qty 1

## 2022-06-07 MED ORDER — CLOPIDOGREL BISULFATE 75 MG PO TABS
75.0000 mg | ORAL_TABLET | Freq: Every day | ORAL | Status: DC
  Administered 2022-06-08: 75 mg via ORAL
  Filled 2022-06-07: qty 1

## 2022-06-07 MED ORDER — BUPROPION HCL ER (XL) 150 MG PO TB24
300.0000 mg | ORAL_TABLET | Freq: Every day | ORAL | Status: DC
  Administered 2022-06-09: 300 mg via ORAL
  Filled 2022-06-07: qty 1
  Filled 2022-06-07: qty 2

## 2022-06-07 MED ORDER — ESCITALOPRAM OXALATE 10 MG PO TABS
20.0000 mg | ORAL_TABLET | Freq: Every day | ORAL | Status: DC
  Administered 2022-06-08: 20 mg via ORAL
  Filled 2022-06-07: qty 2
  Filled 2022-06-07: qty 1

## 2022-06-07 MED ORDER — LAMOTRIGINE 100 MG PO TABS
100.0000 mg | ORAL_TABLET | Freq: Two times a day (BID) | ORAL | Status: DC
  Administered 2022-06-07 – 2022-06-09 (×4): 100 mg via ORAL
  Filled 2022-06-07 (×4): qty 1

## 2022-06-07 MED ORDER — ISOSORBIDE MONONITRATE ER 30 MG PO TB24
30.0000 mg | ORAL_TABLET | Freq: Every day | ORAL | Status: DC
  Administered 2022-06-09: 30 mg via ORAL
  Filled 2022-06-07 (×2): qty 1

## 2022-06-07 NOTE — ED Notes (Signed)
Assumed care of pt, found him alert and oriented pain free at this time.  No needs or requests as well.

## 2022-06-07 NOTE — ED Provider Notes (Signed)
[facility] EMERGENCY DEPARTMENT Provider Note   CSN: [id number] Arrival date & time: 06/07/22  1735     History  No chief complaint on file.   Mark Maynard. is a 67 y.o. male.  Patient is a 67 year old male with a past medical history of CAD status post CABG, hypertension, diabetes, depression/anxiety presenting to the emergency department with chest pain.  The patient states for the last 10 days he has had chest pain mostly occurs on exertion.  He states that it will be a tightness type of pain that radiates into his neck and down his left arm.  He states that the pain has been happening more frequently which prompted him to come to the emergency department.  He states that the pain will last for several minutes but will resolve with rest.  He states he has associated shortness of breath and diaphoresis but denies any nausea or vomiting.  He states that he was initially hoping that his chest pain would go away which is what delaying him coming to the ER.  He states that he has not seen a cardiologist in several years, never in the outpatient setting and his last cath he believes was in 2017.  The history is provided by the patient.       Home Medications Prior to Admission medications   Medication Sig Start Date End Date Taking? Authorizing Provider  AMBULATORY NON FORMULARY MEDICATION Single glucometer with lancets, test strips.   Check fasting morning blood sugar QD. Please bill with insurance. 07/16/17   Carlis Stable, PA-C  aspirin EC 81 MG tablet Take 1 tablet (81 mg total) by mouth daily. 07/16/17   Carlis Stable, PA-C  atenolol (TENORMIN) 25 MG tablet Take 0.5 tablets (12.5 mg total) by mouth daily. 12/29/17   Carlis Stable, PA-C  atorvastatin (LIPITOR) 80 MG tablet Take 1 tablet (80 mg total) by mouth daily. Patient taking differently: Take 40 mg by mouth daily. 07/16/17   Carlis Stable, PA-C  atorvastatin  (LIPITOR) 80 MG tablet Take by mouth. 07/07/19   [provider]  buPROPion (WELLBUTRIN XL) 300 MG 24 hr tablet TAKE ONE TABLET BY MOUTH IN THE MORNING. Now is 300mg  05/07/22   05/09/22, MD  carvedilol (COREG) 25 MG tablet Take 1 tablet by mouth 2 (two) times daily. 07/07/19   [provider]  clopidogrel (PLAVIX) 75 MG tablet Take 1 tablet (75 mg total) by mouth daily. 07/16/17   07/18/17, PA-C  Dulaglutide (TRULICITY) 1.5 MG/0.5ML SOPN Inject 1.5 mg into the skin once a week. 06/06/18   06/08/18, PA-C  empagliflozin (JARDIANCE) 25 MG TABS tablet Take 0.5 tablets by mouth daily. 02/10/21   [provider]  escitalopram (LEXAPRO) 20 MG tablet Take 1 tablet (20 mg total) by mouth at bedtime. 05/07/22   05/09/22, MD  isosorbide mononitrate (IMDUR) 30 MG 24 hr tablet Take 1 tablet (30 mg total) by mouth daily. 07/16/17   07/18/17, PA-C  lamoTRIgine (LAMICTAL) 100 MG tablet Take 1 tablet (100 mg total) by mouth 2 (two) times daily. 05/07/22   05/09/22, MD  metFORMIN (GLUCOPHAGE-XR) 500 MG 24 hr tablet Take 1 tablet (500 mg total) by mouth 2 (two) times daily. 02/28/18   04/30/18, MD  Multiple Vitamin (MULTIVITAMIN) tablet Take by mouth.    [provider]  nitroGLYCERIN (NITROSTAT) 0.4 MG SL tablet Place 1 tablet (0.4 mg total) under the  tongue every 5 (five) minutes as needed for chest pain (or tightness). 07/16/17   Trixie Dredge, PA-C  pantoprazole (PROTONIX) 20 MG tablet TAKE 1 TABLET BY MOUTH ONCE DAILY 03/21/18   Trixie Dredge, PA-C  ARIPiprazole (ABILIFY) 15 MG tablet Take 1 tablet (15 mg total) by mouth at bedtime. 09/13/17 12/23/21  Trixie Dredge, PA-C  clonazePAM (KLONOPIN) 0.5 MG tablet TAKE 1 TABLET BY MOUTH EVERY 12 HOURS AS NEEDED FOR ANXIETY 04/15/18 11/11/21  Silverio Decamp, MD      Allergies    Patient has no known allergies.     Review of Systems   Review of Systems  Physical Exam Updated Vital Signs BP (!) 153/88   Pulse 63   Temp 97.8 F (36.6 C) (Oral)   Resp 14   Ht 5\' 8"  (1.727 m)   Wt 66.7 kg   SpO2 99%   BMI 22.35 kg/m  Physical Exam Vitals and nursing note reviewed.  Constitutional:      General: He is not in acute distress.    Appearance: Normal appearance.  HENT:     Head: Normocephalic and atraumatic.     Nose: Nose normal.     Mouth/Throat:     Mouth: Mucous membranes are moist.     Pharynx: Oropharynx is clear.  Eyes:     Extraocular Movements: Extraocular movements intact.     Conjunctiva/sclera: Conjunctivae normal.  Cardiovascular:     Rate and Rhythm: Normal rate and regular rhythm.     Pulses: Normal pulses.     Heart sounds: Normal heart sounds.  Pulmonary:     Effort: Pulmonary effort is normal.     Breath sounds: Normal breath sounds.  Abdominal:     General: Abdomen is flat.     Palpations: Abdomen is soft.     Tenderness: There is no abdominal tenderness.  Musculoskeletal:        General: Normal range of motion.     Cervical back: Normal range of motion and neck supple.     Right lower leg: No edema.     Left lower leg: No edema.  Skin:    General: Skin is warm and dry.  Neurological:     General: No focal deficit present.     Mental Status: He is alert and oriented to person, place, and time.  Psychiatric:        Mood and Affect: Mood normal.        Behavior: Behavior normal.     ED Results / Procedures / Treatments   Labs (all labs ordered are listed, but only abnormal results are displayed) Labs Reviewed  BASIC METABOLIC PANEL - Abnormal; Notable for the following components:      Result Value   Sodium 133 (*)    Glucose, Bld 161 (*)    Calcium 8.4 (*)    All other components within normal limits  CBC  TROPONIN I (HIGH SENSITIVITY)  TROPONIN I (HIGH SENSITIVITY)    EKG EKG Interpretation  Date/Time:  Sunday June 07 2022 17:43:50  EST Ventricular Rate:  64 PR Interval:  152 QRS Duration: 142 QT Interval:  458 QTC Calculation: 472 R Axis:   100 Text Interpretation: Normal sinus rhythm Rightward axis Left bundle branch block Abnormal ECG No previous ECGs available Confirmed by Leanord Asal ([id number]) on 06/07/2022 6:05:02 PM  Radiology DG Chest 2 View  Result Date: 06/07/2022 CLINICAL DATA:  Chest pain radiating to left arm. Worsening dyspnea. EXAM: CHEST -  2 VIEW COMPARISON:  12/29/2017 FINDINGS: The heart size and mediastinal contours are within normal limits. Prior CABG and coronary stents again noted. Both lungs are clear. The visualized skeletal structures are unremarkable. IMPRESSION: No active cardiopulmonary disease. Electronically Signed   By: Marlaine Hind M.D.   On: 06/07/2022 17:58    Procedures Procedures    Medications Ordered in ED Medications  aspirin chewable tablet 324 mg (has no administration in time range)    ED Course/ Medical Decision Making/ A&P Clinical Course as of 06/07/22 1925  Nancy Fetter Jun 07, 2022  1901 Initial troponin is negative.  Due to patient's high risk chest pain, I will speak with cardiology and recommendations for possible inpatient observation. [VK]  1909 I spoke with Dr. Kalman Shan with cardiology who recommended admission for high risk chest pain to [facility] as he will likely need cath. He does not recommend starting heparin at this time. [VK]    Clinical Course User Index [VK] Kemper Durie, DO             HEART Score: 6                Medical Decision Making This patient presents to the ED with chief complaint(s) of chest pain with pertinent past medical history of CAD status post CABG, hypertension, diabetes, depression/anxiety which further complicates the presenting complaint. The complaint involves an extensive differential diagnosis and also carries with it a high risk of complications and morbidity.    The differential diagnosis includes ACS, arrhythmia,  anemia, electrolyte abnormality, pulmonary edema, pleural effusion, pneumothorax, and pneumonia  Additional history obtained: Additional history obtained from N/A Records reviewed Care Everywhere/External Records  ED Course and Reassessment: Patient had an EKG performed on arrival that shows a left bundle branch block without previous EKGs for comparison.  He is currently chest pain-free.  He will have labs including troponin and chest x-ray performed.  He will be closely reassessed.  Due to patient's high risk chest pain, he will likely require cardiology consultation to determine disposition.  Independent labs interpretation:  The following labs were independently interpreted: Within normal range  Independent visualization of imaging: - I independently visualized the following imaging with scope of interpretation limited to determining acute life threatening conditions related to emergency care: Chest x-ray, which revealed no acute disease  Consultation: - Consulted or discussed management/test interpretation w/ external professional: Cardiology, hospitalist  Consideration for admission or further workup: Patient requires admission for further cardiac workup Social Determinants of health: N/A    Amount and/or Complexity of Data Reviewed Labs: ordered. Radiology: ordered.  Risk Decision regarding hospitalization.          Final Clinical Impression(s) / ED Diagnoses Final diagnoses:  Exertional angina    Rx / DC Orders ED Discharge Orders     None         Kemper Durie, DO 06/07/22 1925

## 2022-06-07 NOTE — ED Notes (Signed)
Pt understands plan for transfer.  No questions at this time.

## 2022-06-07 NOTE — ED Notes (Addendum)
Rn was notified that pt left.  RN caught up w/ pt walking to his car and convinced him to come back into the room.  Pt explained that he was hungry and did not like the options he was given.  Pt explained that "I have mental health problems and anxiety."  RN spoke to provider who ordered medication for pt.  NT assisted pt in ordering in food. While pt is cooperative he continues to be anxious.  RN will continue to monitor.    RN also explained that if he decided to leave again to please let us know so that his IV could be removed b/c we can't let him leave w/ it.  He verbalized understanding.

## 2022-06-07 NOTE — ED Notes (Signed)
Pt reports he feels much better and thanked/apologized to RN for running out the door.  RN expressed understanding of the situation. RN was able to convince pt to allow monitoring equipment at this time. Pt requested a warm blanket which was given and pt decided he would try to sleep.

## 2022-06-07 NOTE — Progress Notes (Signed)
Plan of Care Note for accepted transfer   Patient: Mark Maynard. MRN: [id number]   DOA: 06/07/2022  Facility requesting transfer: [facility]  Requesting Provider: Dr. Maylon Peppers   Reason for transfer: Chest pain   Facility course: 67 yr old man with hx of HTN, DM, and CAD presenting with 10 days of worsening exertional angina and DOE. Initial troponin is 5, EKG demonstrates LBBB, and CXR is negative for acute findings. ED physician discussed with cardiology fellow (Dr. Kalman Shan) who recommended admission to [facility]. He was given 324 mg ASA. Second troponin pending.   Plan of care: The patient is accepted for admission to Telemetry unit, at [facility].   Author: Vianne Bulls, MD 06/07/2022  Check [email or web address] for on-call coverage.  Nursing staff, Please call [facility] number on Amion as soon as patient's arrival, so appropriate admitting provider can evaluate the pt.

## 2022-06-07 NOTE — ED Triage Notes (Signed)
Pt POV c/o chest pain, radiating to left arm, up left neck. Pain started 10 days ago, progressively worsening. Increased dyspnea with exertion.  Hx of CABG.  Denies swelling.  Taking plavix.

## 2022-06-07 NOTE — ED Notes (Signed)
Patient transported to X-ray 

## 2022-06-08 ENCOUNTER — Ambulatory Visit (HOSPITAL_COMMUNITY)
Admission: EM | Disposition: A | Discharge status: Home / Self Care | Payer: Self-pay | Source: Home / Self Care | Attending: Emergency Medicine

## 2022-06-08 DIAGNOSIS — Z955 Presence of coronary angioplasty implant and graft: Secondary | ICD-10-CM | POA: Diagnosis not present

## 2022-06-08 DIAGNOSIS — I2510 Atherosclerotic heart disease of native coronary artery without angina pectoris: Secondary | ICD-10-CM

## 2022-06-08 DIAGNOSIS — E782 Mixed hyperlipidemia: Secondary | ICD-10-CM

## 2022-06-08 DIAGNOSIS — Z79899 Other long term (current) drug therapy: Secondary | ICD-10-CM | POA: Diagnosis not present

## 2022-06-08 DIAGNOSIS — Z7982 Long term (current) use of aspirin: Secondary | ICD-10-CM | POA: Diagnosis not present

## 2022-06-08 DIAGNOSIS — E785 Hyperlipidemia, unspecified: Secondary | ICD-10-CM

## 2022-06-08 DIAGNOSIS — Z7902 Long term (current) use of antithrombotics/antiplatelets: Secondary | ICD-10-CM | POA: Diagnosis not present

## 2022-06-08 DIAGNOSIS — Z9861 Coronary angioplasty status: Secondary | ICD-10-CM

## 2022-06-08 DIAGNOSIS — R079 Chest pain, unspecified: Secondary | ICD-10-CM | POA: Diagnosis not present

## 2022-06-08 DIAGNOSIS — I10 Essential (primary) hypertension: Secondary | ICD-10-CM

## 2022-06-08 DIAGNOSIS — I25110 Atherosclerotic heart disease of native coronary artery with unstable angina pectoris: Secondary | ICD-10-CM

## 2022-06-08 DIAGNOSIS — E119 Type 2 diabetes mellitus without complications: Secondary | ICD-10-CM | POA: Diagnosis not present

## 2022-06-08 DIAGNOSIS — Z951 Presence of aortocoronary bypass graft: Secondary | ICD-10-CM | POA: Diagnosis not present

## 2022-06-08 DIAGNOSIS — Z7984 Long term (current) use of oral hypoglycemic drugs: Secondary | ICD-10-CM | POA: Diagnosis not present

## 2022-06-08 DIAGNOSIS — I200 Unstable angina: Secondary | ICD-10-CM

## 2022-06-08 DIAGNOSIS — E1169 Type 2 diabetes mellitus with other specified complication: Secondary | ICD-10-CM | POA: Diagnosis not present

## 2022-06-08 DIAGNOSIS — R0789 Other chest pain: Secondary | ICD-10-CM | POA: Diagnosis not present

## 2022-06-08 HISTORY — PX: CORONARY BALLOON ANGIOPLASTY: CATH118233

## 2022-06-08 HISTORY — PX: LEFT HEART CATH AND CORS/GRAFTS ANGIOGRAPHY: CATH118250

## 2022-06-08 LAB — POCT ACTIVATED CLOTTING TIME
Activated Clotting Time: 293 seconds
Activated Clotting Time: 325 seconds
Activated Clotting Time: 223 seconds
Activated Clotting Time: 168 seconds
Activated Clotting Time: 190 seconds

## 2022-06-08 LAB — GLUCOSE, CAPILLARY
Glucose-Capillary: 107 mg/dL — ABNORMAL HIGH (ref 70–99)
Glucose-Capillary: 168 mg/dL — ABNORMAL HIGH (ref 70–99)

## 2022-06-08 LAB — TROPONIN I (HIGH SENSITIVITY): Troponin I (High Sensitivity): 6 ng/L (ref <18)

## 2022-06-08 LAB — HEMOGLOBIN A1C
Hgb A1c MFr Bld: 7.4 % — ABNORMAL HIGH (ref 4.8–5.6)
Mean Plasma Glucose: 165.68 mg/dL

## 2022-06-08 LAB — CBG MONITORING, ED
Glucose-Capillary: 149 mg/dL — ABNORMAL HIGH (ref 70–99)
Glucose-Capillary: 254 mg/dL — ABNORMAL HIGH (ref 70–99)

## 2022-06-08 SURGERY — LEFT HEART CATH AND CORS/GRAFTS ANGIOGRAPHY
Anesthesia: LOCAL

## 2022-06-08 MED ORDER — TICAGRELOR 90 MG PO TABS
90.0000 mg | ORAL_TABLET | Freq: Two times a day (BID) | ORAL | Status: DC
  Administered 2022-06-09: 90 mg via ORAL
  Filled 2022-06-08: qty 1

## 2022-06-08 MED ORDER — EMPAGLIFLOZIN 10 MG PO TABS
10.0000 mg | ORAL_TABLET | Freq: Every day | ORAL | Status: DC
  Administered 2022-06-08 – 2022-06-09 (×2): 10 mg via ORAL
  Filled 2022-06-08 (×2): qty 1

## 2022-06-08 MED ORDER — FENTANYL CITRATE (PF) 100 MCG/2ML IJ SOLN
INTRAMUSCULAR | Status: DC | PRN
  Administered 2022-06-08: 25 ug via INTRAVENOUS

## 2022-06-08 MED ORDER — NITROGLYCERIN 0.4 MG SL SUBL: 0.4000 mg | SUBLINGUAL_TABLET | SUBLINGUAL | Status: DC | PRN

## 2022-06-08 MED ORDER — HEPARIN (PORCINE) IN NACL 1000-0.9 UT/500ML-% IV SOLN
INTRAVENOUS | Status: DC | PRN
  Administered 2022-06-08 (×2): 500 mL

## 2022-06-08 MED ORDER — VERAPAMIL HCL 2.5 MG/ML IV SOLN
INTRAVENOUS | Status: AC
  Filled 2022-06-08: qty 2

## 2022-06-08 MED ORDER — ATORVASTATIN CALCIUM 80 MG PO TABS: 80.0000 mg | ORAL_TABLET | Freq: Every day | ORAL | Status: DC

## 2022-06-08 MED ORDER — HEPARIN SODIUM (PORCINE) 5000 UNIT/ML IJ SOLN
5000.0000 [IU] | Freq: Three times a day (TID) | INTRAMUSCULAR | Status: DC
  Administered 2022-06-08 – 2022-06-09 (×2): 5000 [IU] via SUBCUTANEOUS
  Filled 2022-06-08 (×2): qty 1

## 2022-06-08 MED ORDER — TICAGRELOR 90 MG PO TABS
ORAL_TABLET | ORAL | Status: DC | PRN
  Administered 2022-06-08: 180 mg via ORAL

## 2022-06-08 MED ORDER — MORPHINE SULFATE (PF) 2 MG/ML IV SOLN
2.0000 mg | INTRAVENOUS | Status: DC | PRN
  Administered 2022-06-08: 2 mg via INTRAVENOUS

## 2022-06-08 MED ORDER — MIDAZOLAM HCL 2 MG/2ML IJ SOLN
INTRAMUSCULAR | Status: DC | PRN
  Administered 2022-06-08: 1 mg via INTRAVENOUS

## 2022-06-08 MED ORDER — HEPARIN (PORCINE) IN NACL 1000-0.9 UT/500ML-% IV SOLN
INTRAVENOUS | Status: AC
  Filled 2022-06-08: qty 500

## 2022-06-08 MED ORDER — ACETAMINOPHEN 325 MG PO TABS: 650.0000 mg | ORAL_TABLET | ORAL | Status: DC | PRN

## 2022-06-08 MED ORDER — MORPHINE SULFATE (PF) 2 MG/ML IV SOLN
INTRAVENOUS | Status: AC
  Filled 2022-06-08: qty 1

## 2022-06-08 MED ORDER — FENTANYL CITRATE (PF) 100 MCG/2ML IJ SOLN
INTRAMUSCULAR | Status: AC
  Filled 2022-06-08: qty 2

## 2022-06-08 MED ORDER — LIDOCAINE HCL (PF) 1 % IJ SOLN
INTRAMUSCULAR | Status: AC
  Filled 2022-06-08: qty 30

## 2022-06-08 MED ORDER — ONDANSETRON HCL 4 MG/2ML IJ SOLN: 4.0000 mg | Freq: Four times a day (QID) | INTRAMUSCULAR | Status: DC | PRN

## 2022-06-08 MED ORDER — HEPARIN SODIUM (PORCINE) 1000 UNIT/ML IJ SOLN
INTRAMUSCULAR | Status: AC
  Filled 2022-06-08: qty 10

## 2022-06-08 MED ORDER — LABETALOL HCL 5 MG/ML IV SOLN: 10.0000 mg | INTRAVENOUS | Status: AC | PRN

## 2022-06-08 MED ORDER — TICAGRELOR 90 MG PO TABS
ORAL_TABLET | ORAL | Status: AC
  Filled 2022-06-08: qty 2

## 2022-06-08 MED ORDER — SODIUM CHLORIDE 0.9 % IV SOLN: INTRAVENOUS | Status: AC

## 2022-06-08 MED ORDER — NITROGLYCERIN 1 MG/10 ML FOR IR/CATH LAB
INTRA_ARTERIAL | Status: AC
  Filled 2022-06-08: qty 10

## 2022-06-08 MED ORDER — LIDOCAINE HCL (PF) 1 % IJ SOLN
INTRAMUSCULAR | Status: DC | PRN
  Administered 2022-06-08: 5 mL

## 2022-06-08 MED ORDER — ATORVASTATIN CALCIUM 80 MG PO TABS
80.0000 mg | ORAL_TABLET | Freq: Every day | ORAL | Status: DC
  Administered 2022-06-09: 80 mg via ORAL
  Filled 2022-06-08 (×3): qty 1

## 2022-06-08 MED ORDER — ASPIRIN 81 MG PO CHEW
81.0000 mg | CHEWABLE_TABLET | Freq: Every day | ORAL | Status: DC
  Administered 2022-06-09: 81 mg via ORAL
  Filled 2022-06-08: qty 1

## 2022-06-08 MED ORDER — HEPARIN SODIUM (PORCINE) 1000 UNIT/ML IJ SOLN
INTRAMUSCULAR | Status: DC | PRN
  Administered 2022-06-08: 7000 [IU] via INTRAVENOUS

## 2022-06-08 MED ORDER — HYDRALAZINE HCL 20 MG/ML IJ SOLN: 10.0000 mg | INTRAMUSCULAR | Status: AC | PRN

## 2022-06-08 MED ORDER — MIDAZOLAM HCL 2 MG/2ML IJ SOLN
INTRAMUSCULAR | Status: AC
  Filled 2022-06-08: qty 2

## 2022-06-08 MED ORDER — IOHEXOL 350 MG/ML SOLN
INTRAVENOUS | Status: DC | PRN
  Administered 2022-06-08: 125 mL

## 2022-06-08 MED ORDER — ACETAMINOPHEN 325 MG PO TABS
650.0000 mg | ORAL_TABLET | Freq: Four times a day (QID) | ORAL | Status: DC | PRN
  Administered 2022-06-08: 650 mg via ORAL
  Filled 2022-06-08: qty 2

## 2022-06-08 MED ORDER — ADULT MULTIVITAMIN W/MINERALS CH
1.0000 | ORAL_TABLET | Freq: Every day | ORAL | Status: DC
  Administered 2022-06-08 – 2022-06-09 (×2): 1 via ORAL
  Filled 2022-06-08 (×2): qty 1

## 2022-06-08 SURGICAL SUPPLY — 17 items
BALL SAPPHIRE NC24 2.50X10 (BALLOONS) ×1
BALLN SAPPHIRE 2.0X12 (BALLOONS) ×1
BALLOON SAPPHIRE 2.0X12 (BALLOONS) IMPLANT
BALLOON SAPPHIRE NC24 2.50X10 (BALLOONS) IMPLANT
CATH INFINITI 5FR MULTPACK ANG (CATHETERS) IMPLANT
CATH VISTA GUIDE 6FR XB3 (CATHETERS) IMPLANT
CLOSURE MYNX CONTROL 6F/7F (Vascular Products) IMPLANT
KIT ENCORE 26 ADVANTAGE (KITS) IMPLANT
KIT HEART LEFT (KITS) ×1 IMPLANT
PACK CARDIAC CATHETERIZATION (CUSTOM PROCEDURE TRAY) ×1 IMPLANT
SHEATH PINNACLE 5F 10CM (SHEATH) IMPLANT
SHEATH PINNACLE 6F 10CM (SHEATH) IMPLANT
SYR MEDRAD MARK 7 150ML (SYRINGE) ×1 IMPLANT
TRANSDUCER W/STOPCOCK (MISCELLANEOUS) ×1 IMPLANT
TUBING CIL FLEX 10 FLL-RA (TUBING) ×1 IMPLANT
WIRE ASAHI PROWATER 300CM (WIRE) IMPLANT
WIRE EMERALD 3MM-J .035X150CM (WIRE) IMPLANT

## 2022-06-08 NOTE — H&P (View-Only) (Signed)
Cardiology Consultation   Patient ID: Doreene Adas. MRN: [id number]; DOB: 17-Oct-1955  Admit date: 06/07/2022 Date of Consult: 06/08/2022  PCP:  Lurline Hare, MD   [facility] Providers Cardiologist:  None        Patient Profile:   Doreene Adas. is a 67 y.o. male with a hx of CAD s/p CABG and multiple PCIs, diabetes mellitus type II, essential hypertension, hyperlipidemia who is being seen 06/08/2022 for the evaluation of chest pain at the request of [facility].  History of Present Illness:   Mr. Barriere presented to the [facility] ED on 1/14 with symptoms of chest pain. He says that he began to notice exertionally associated chest pain about 10 days ago, though he now says in hindsight, symptoms may have begun earlier and he minimized. Pain radiates into his neck and left arm. Patient describes having to take a break while walking around the grocery store due to pain and dyspnea. He finally presented to the ED because the frequency of these symptoms began to increase and reminded him of previous MI pain. He denies orthopnea, palpitations, leg swelling, or weight gain. Patient does have a prescription for nitro but did not think to take it during episodes of pain. He reports compliance with his ASA and Plavix.    Patient says that his CABG was in 2002 in the [place] area. He has since undergone multiple left heart catheterizations with multiple stents placed, most recently in [place] in 2019. Patient says that he has not been regularly followed by cardiology but does have an established PCP at the [facility].    Past Medical History:  Diagnosis Date   Anxiety    CAD (coronary artery disease)    native 3-vessel disease   Choledocholithiasis    Depression    Diabetes mellitus without complication ([place])    Hyperlipidemia    Hypertension    Myocardial infarction ([place]) 2012   x 3, s/p CABG, 9 stents   Right middle lobe pneumonia 11/09/2017   Right  upper lobe pulmonary nodule 11/09/2017    Past Surgical History:  Procedure Laterality Date   CORONARY ARTERY BYPASS GRAFT  2012   CORONARY ARTERY BYPASS GRAFT     EYE SURGERY  1980's   ptyrgium removal   PERCUTANEOUS CORONARY STENT INTERVENTION (PCI-S)  06/2017     Home Medications:  Prior to Admission medications   Medication Sig Start Date End Date Taking? Authorizing Provider  AMBULATORY NON FORMULARY MEDICATION Single glucometer with lancets, test strips.   Check fasting morning blood sugar QD. Please bill with insurance. 07/16/17   Trixie Dredge, PA-C  aspirin EC 81 MG tablet Take 1 tablet (81 mg total) by mouth daily. 07/16/17   Trixie Dredge, PA-C  atenolol (TENORMIN) 25 MG tablet Take 0.5 tablets (12.5 mg total) by mouth daily. 12/29/17   Trixie Dredge, PA-C  atorvastatin (LIPITOR) 80 MG tablet Take 1 tablet (80 mg total) by mouth daily. Patient taking differently: Take 40 mg by mouth daily. 07/16/17   Trixie Dredge, PA-C  atorvastatin (LIPITOR) 80 MG tablet Take by mouth. 07/07/19   [provider]  buPROPion (WELLBUTRIN XL) 300 MG 24 hr tablet TAKE ONE TABLET BY MOUTH IN THE MORNING. Now is 300mg  05/07/22   Merian Capron, MD  carvedilol (COREG) 25 MG tablet Take 1 tablet by mouth 2 (two) times daily. 07/07/19   [provider]  clopidogrel (PLAVIX) 75  MG tablet Take 1 tablet (75 mg total) by mouth daily. 07/16/17   Cummings, Charley Elizabeth, PA-C  Dulaglutide (TRULICITY) 1.5 MG/0.5ML SOPN Inject 1.5 mg into the skin once a week. 06/06/18   Cummings, Charley Elizabeth, PA-C  empagliflozin (JARDIANCE) 25 MG TABS tablet Take 0.5 tablets by mouth daily. 02/10/21   [provider]  escitalopram (LEXAPRO) 20 MG tablet Take 1 tablet (20 mg total) by mouth at bedtime. 05/07/22   Akhtar, Nadeem, MD  isosorbide mononitrate (IMDUR) 30 MG 24 hr tablet Take 1 tablet (30 mg total) by mouth daily. 07/16/17   Cummings,  Charley Elizabeth, PA-C  lamoTRIgine (LAMICTAL) 100 MG tablet Take 1 tablet (100 mg total) by mouth 2 (two) times daily. 05/07/22   Akhtar, Nadeem, MD  metFORMIN (GLUCOPHAGE-XR) 500 MG 24 hr tablet Take 1 tablet (500 mg total) by mouth 2 (two) times daily. 02/28/18   Metheney, Catherine D, MD  Multiple Vitamin (MULTIVITAMIN) tablet Take by mouth.    [provider]  nitroGLYCERIN (NITROSTAT) 0.4 MG SL tablet Place 1 tablet (0.4 mg total) under the tongue every 5 (five) minutes as needed for chest pain (or tightness). 07/16/17   Cummings, Charley Elizabeth, PA-C  pantoprazole (PROTONIX) 20 MG tablet TAKE 1 TABLET BY MOUTH ONCE DAILY 03/21/18   Cummings, Charley Elizabeth, PA-C  ARIPiprazole (ABILIFY) 15 MG tablet Take 1 tablet (15 mg total) by mouth at bedtime. 09/13/17 12/23/21  Cummings, Charley Elizabeth, PA-C  clonazePAM (KLONOPIN) 0.5 MG tablet TAKE 1 TABLET BY MOUTH EVERY 12 HOURS AS NEEDED FOR ANXIETY 04/15/18 11/11/21  Thekkekandam, Thomas J, MD    Inpatient Medications: Scheduled Meds:  aspirin EC  81 mg Oral Daily   [START ON 06/09/2022] atorvastatin  80 mg Oral Daily   buPROPion  300 mg Oral Daily   carvedilol  25 mg Oral BID   clopidogrel  75 mg Oral Daily   empagliflozin  10 mg Oral Daily   escitalopram  20 mg Oral QHS   heparin injection (subcutaneous)  5,000 Units Subcutaneous Q8H   insulin aspart  0-5 Units Subcutaneous QHS   insulin aspart  0-9 Units Subcutaneous TID WC   isosorbide mononitrate  30 mg Oral Daily   lamoTRIgine  100 mg Oral BID   multivitamin with minerals  1 tablet Oral Daily   pantoprazole  20 mg Oral Daily   Continuous Infusions:  PRN Meds:   Allergies:   No Known Allergies  Social History:   Social History   Socioeconomic History   Marital status: Divorced    Spouse name: Not on file   Number of children: Not on file   Years of education: Not on file   Highest education level: Not on file  Occupational History   Not on file  Tobacco  Use   Smoking status: Never   Smokeless tobacco: Never  Vaping Use   Vaping Use: Never used  Substance and Sexual Activity   Alcohol use: Not Currently    Alcohol/week: 1.0 standard drink of alcohol    Types: 1 Cans of beer per week   Drug use: No   Sexual activity: Yes    Birth control/protection: None  Other Topics Concern   Not on file  Social History Narrative   Not on file   Social Determinants of Health   Financial Resource Strain: Not on file  Food Insecurity: No Food Insecurity (06/08/2022)   Hunger Vital Sign    Worried About Running Out of Food in the Last   Year: Never true    [place] in the Last Year: Never true  Transportation Needs: No Transportation Needs (06/08/2022)   PRAPARE - Hydrologist (Medical): No    Lack of Transportation (Non-Medical): No  Physical Activity: Not on file  Stress: Not on file  Social Connections: Not on file  Intimate Partner Violence: Not At Risk (06/08/2022)   Humiliation, Afraid, Rape, and Kick questionnaire    Fear of Current or Ex-Partner: No    Emotionally Abused: No    Physically Abused: No    Sexually Abused: No    Family History:    Family History  Problem Relation Age of Onset   Melanoma Father      ROS:  Please see the history of present illness.   All other ROS reviewed and negative.     Physical Exam/Data:   Vitals:   06/08/22 1000 06/08/22 1100 06/08/22 1448 06/08/22 1542  BP: (!) 103/50 126/61 136/73 (!) 140/73  Pulse: 68 65 60 (!) 58  Resp: 17 18 16 11   Temp: 98 F (36.7 C) 98.3 F (36.8 C) 97.8 F (36.6 C) 97.9 F (36.6 C)  TempSrc:  Oral Oral Axillary  SpO2: 98% 98% 96% 98%  Weight:   65.7 kg   Height:       No intake or output data in the 24 hours ending 06/08/22 1606    06/08/2022    2:48 PM 06/07/2022    5:40 PM 05/07/2022   12:59 PM  Last 3 Weights  Weight (lbs) 144 lb 13.5 oz 147 lb   Weight (kg) 65.7 kg 66.679 kg      Information is confidential  and restricted. Go to Review Flowsheets to unlock data.     Body mass index is 22.02 kg/m.  General:  Well nourished, well developed, in no acute distress HEENT: normal Neck: no JVD Vascular: No carotid bruits; Distal pulses 2+ bilaterally Cardiac:  normal S1, S2; RRR; no murmur  Lungs:  clear to auscultation bilaterally, no wheezing, rhonchi or rales  Abd: soft, nontender, no hepatomegaly  Ext: no edema Musculoskeletal:  No deformities, BUE and BLE strength normal and equal Skin: warm and dry  Neuro:  CNs 2-12 intact, no focal abnormalities noted Psych:  Normal affect   EKG:  The EKG was personally reviewed and demonstrates:  sinus rhythm with left bundle branch block (known since a least 2021). Telemetry:  Telemetry was personally reviewed and demonstrates:  sinus rhythm  Relevant CV Studies:  07/10/2017 TTE  Interpretation Summary A complete two-dimensional transthoracic echocardiogram was performed (2D, M-mode, Doppler and color flow Doppler). GLS performed and evaluated. The study was technically adequate. There is no comparison study available. Left ventricular systolic function is borderline reduced. Septal motion is consistent with post-operative state. There is mild mitral regurgitation. There is mild tricuspid regurgitation. Right ventricular systolic pressure is elevated at 40-17mmHg. Left Ventricle The left ventricle is normal in size. There is no thrombus. There is normal left ventricular wall thickness. Left ventricular systolic function is borderline reduced. Ejection Fraction = 50-55%. The transmitral spectral Doppler flow pattern is suggestive of impaired LV relaxation. Septal motion is consistent with post-operative state. Right Ventricle The right ventricle is normal in size and function. Atria The left atrium is mildly dilated. Right atrial size is normal. The IVC is normal in size. The interatrial septum is intact with no evidence for an atrial septal defect. Mitral Valve  The mitral valve leaflets  are mildly thickened, but open well. There is mild mitral regurgitation. Tricuspid Valve The tricuspid valve leaflets are thin and pliable. There is mild tricuspid regurgitation. Right ventricular systolic pressure is elevated at 40- 50mmHg. Aortic Valve The aortic valve is trileaflet. The aortic valve opens well. Mild aortic valve sclerosis without evidence of stenosis. No aortic regurgitation is present. Pulmonic Valve The pulmonic valve is not well seen, but is grossly normal. Trace pulmonic valvular regurgitation. Great Vessels The aortic root is normal size. Pericardium/Pleural There is no pericardial effusion.   06/08/2019 LHC ([facility])  FINDINGS:   Coronary Angiography 1. Left Main -patent, there is a stent that extends into the circumflex. It is widely patent.  2. Left anterior descending artery -occluded at the ostium. There is a previously noted stent in the LAD. 3. Left Circumflex -widely patent. There is 1 large obtuse marginal artery which is widely patent 4. Right Coronary Artery -there is a stent in the proximal portion. It is widely patent. The PDA and PLV branches are widely patent 5. There is a saphenous vein graft that supplies an intermediate ramus artery. It is widely patent. 6. The left internal mammary artery to the LAD is patent. After the graft there is a 70% stenosis in the LAD, and more distally, there is an 80 to 90% stenosis in the LAD. This is similar to before. Additional comments on angiography: Right dominant   Hemodynamics 1. Aortic Pressure -106/61 mmHg 2. Left Ventricular -103/5 mmHg   Left ventricular gram was performed. The EF is 55 to 60% with anterior hypokinesis   CONCLUSIONS:  1. Multivessel coronary artery disease 2. 2 of 2 grafts patent 3. There is significant disease in the LAD after the LIMA. It is too small for percutaneous intervention and is similar to the previous catheterization 4. Normal ejection fraction Post  Hospital Care   Laboratory Data:  High Sensitivity Troponin:   Recent Labs  Lab 06/07/22 1749 06/07/22 1925  TROPONINIHS 5 6     Chemistry Recent Labs  Lab 06/07/22 1749  NA 133*  K 4.2  CL 100  CO2 25  GLUCOSE 161*  BUN 19  CREATININE 0.92  CALCIUM 8.4*  GFRNONAA >60  ANIONGAP 8    No results for input(s): "PROT", "ALBUMIN", "AST", "ALT", "ALKPHOS", "BILITOT" in the last 168 hours. Lipids No results for input(s): "CHOL", "TRIG", "HDL", "LABVLDL", "LDLCALC", "CHOLHDL" in the last 168 hours.  Hematology Recent Labs  Lab 06/07/22 1749  WBC 5.9  RBC 4.46  HGB 13.9  HCT 41.0  MCV 91.9  MCH 31.2  MCHC 33.9  RDW 12.5  PLT 242   Thyroid No results for input(s): "TSH", "FREET4" in the last 168 hours.  BNPNo results for input(s): "BNP", "PROBNP" in the last 168 hours.  DDimer No results for input(s): "DDIMER" in the last 168 hours.   Radiology/Studies:  DG Chest 2 View  Result Date: 06/07/2022 CLINICAL DATA:  Chest pain radiating to left arm. Worsening dyspnea. EXAM: CHEST - 2 VIEW COMPARISON:  12/29/2017 FINDINGS: The heart size and mediastinal contours are within normal limits. Prior CABG and coronary stents again noted. Both lungs are clear. The visualized skeletal structures are unremarkable. IMPRESSION: No active cardiopulmonary disease. Electronically Signed   By: John A Stahl M.D.   On: 06/07/2022 17:58     Assessment and Plan:  Kwabena B Grays Jr. is a 66 y.o. male with a hx of CAD s/p CABG and multiple PCIs, diabetes mellitus type II, essential hypertension, hyperlipidemia,   anxiety/depression who is being seen 06/08/2022 for the evaluation of exertional chest pain at the request of [facility].   Multivessel CAD s/p CABG Hyperlipidemia  Patient's most recent heart cath at [facility] in 2021 found 2 of 2 grafts patent, stable but significant disease in LAD distal to LIMA, too small for PCI. Recent symptoms are reportedly similar to those of previous NSTEMI/ACS  episodes. ECG is without acute ischemic changes and troponin negative/flat at 5, 6.  Although there is no evidence of acute myocardial injury, given significant cardiac history, will plan to evaluate with LHC. Possibly today as schedule allows as patient has been NPO. TTE ordered Continue ASA/Plavix Continue Atorvastatin 80mg  Continue Coreg 25mg  BID Continue Imdur 30mg  extended release Will check lipid panel and lipoprotein A  Shared Decision Making/Informed Consent The risks [stroke (1 in 1000), death (1 in 1000), kidney failure [usually temporary] (1 in 500), bleeding (1 in 200), allergic reaction [possibly serious] (1 in 200)], benefits (diagnostic support and management of coronary artery disease) and alternatives of a cardiac catheterization were discussed in detail with Mr. Votta and he is willing to proceed.   Hypertension  Patient's BP near normal. Continue Coreg 25mg  BID.  Diabetes Mellitus type II  Patient on Trulicity, Jardiance, Metformin. A1c is pending.  Per primary team:  Anxiety/depression  Risk Assessment/Risk Scores:                For questions or updates, please contact [facility] Please consult [email or web address] for contact info under    Signed, , PA-C  06/08/2022 4:06 PM  I have examined the patient and reviewed assessment and plan and discussed with patient.  Agree with above as stated.    Patient with long h/o CAD.  Sx he currently reports are similar to his prior angina.  Had a cath with no PCI target in 2021 [facility].  He has not had a cath from the left wrist in the past.  His caths have all been from the groin. He is on excellent medical therapy. He has had multiple caths at other institutions.  THis would be his first at [facility].   Plan for cath today if possible.  He has not eaten any solid food today.  Had some clear liquids.  All questions about cath were answered.  He is in agreement.    06/10/2022

## 2022-06-08 NOTE — Interval H&P Note (Signed)
Cath Lab Visit (complete for each Cath Lab visit)  Clinical Evaluation Leading to the Procedure:   ACS: Yes.    Non-ACS:    Anginal Classification: CCS II  Anti-ischemic medical therapy: Maximal Therapy (2 or more classes of medications)  Non-Invasive Test Results: No non-invasive testing performed  Prior CABG: Previous CABG      History and Physical Interval Note:  06/08/2022 4:36 PM  Mark Maynard.  has presented today for surgery, with the diagnosis of unstable angina.  The various methods of treatment have been discussed with the patient and family. After consideration of risks, benefits and other options for treatment, the patient has consented to  Procedure(s): LEFT HEART CATH AND CORS/GRAFTS ANGIOGRAPHY (N/A) as a surgical intervention.  The patient's history has been reviewed, patient examined, no change in status, stable for surgery.  I have reviewed the patient's chart and labs.  Questions were answered to the patient's satisfaction.     Quay Burow

## 2022-06-08 NOTE — Progress Notes (Signed)
Site area- right  Site Prior to Removal- 0   Pressure Applied For-  30 MInutes   Bedrest Beginning at - 2050   Manual- Yes   Patient Status During Pull- Stable    Post Pull Groin Site- 0   Post Pull Instructions Given- Yes   Post Pull Pulses Present- Yes    Dressing Applied- Tegaderm and Gauze Dressing    Comments:  Arrived to 2C12 to check ACT and pull sheath. Patient ACT was less than 175, suture cut and removed. Sheath removed, hemostasis easily achievable via manual pressure. Patient noted to have small amount of swelling during sheath removal, easily compressed and did not return. Patient had small 1 inch in diameter area of swelling that was soft and not painful to compression present throughout sheath pull. Area did not change post sheath pull, stayed and monitored 20 minutes post hold to verify no changes. Prominent ligament noted. No hematoma present.  RN Sherri assessed site prior to me leaving bedside. No complications during pull. Instructions given to patient.

## 2022-06-08 NOTE — H&P (Addendum)
TRH H&P   Patient Demographics:    Mark Maynard, is a 67 y.o. male  MRN: [id number]   DOB - 06/24/1955  Admit Date - 06/07/2022  Outpatient Primary MD for the patient is Tenna Delaine, MD  Outpatient Specialists: None in town    Patient coming from: Home >> [facility]  Chief Complaint  Patient presents with   Chest Pain      HPI:    Mark Maynard  is a 67 y.o. male, with history of CAD CABG over 10 years ago followed by multiple stents up to 9.  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  Patient with above history presents to the ER with chief complaint of exertional substernal chest discomfort radiating to his left arm neck and jaw ongoing on a gradually progressive basis for the last 1 month.    Symptoms were gradually getting worse so he presented to med [facility] ER where his initial workup including blood work, EKG and troponin x 3 sets were negative.  Since he had significant history of coronary artery disease with symptoms suggestive of exertional angina cardiology was consulted who recommended hospital observation for further workup.  Patient currently at rest is symptom-free.  Patient currently denies any headache, no fever or chills, he was diagnosed with COVID few weeks ago but he has completely recovered from it, denies any productive cough, no abdominal pain, no diarrhea or dysuria, no blood in stool or urine, no focal weakness.  Currently symptom-free.    Review of systems:     A full 10 point Review of Systems was done, except as stated above, all other Review of Systems were negative.   With Past History of the following :    Past Medical History:  Diagnosis Date   Anxiety    CAD (coronary artery disease)    native 3-vessel disease   Choledocholithiasis    Depression    Diabetes mellitus without complication (HCC)    Hyperlipidemia    Hypertension    Myocardial infarction (HCC) 2012   x 3, s/p CABG, 9 stents   Right  middle lobe pneumonia 11/09/2017   Right upper lobe pulmonary nodule 11/09/2017      Past Surgical History:  Procedure Laterality Date   CORONARY ARTERY BYPASS GRAFT  2012   CORONARY ARTERY BYPASS GRAFT     EYE SURGERY  1980's   ptyrgium removal   PERCUTANEOUS CORONARY STENT INTERVENTION (PCI-S)  06/2017      Social History:     Social History   Tobacco Use   Smoking status: Never   Smokeless tobacco: Never  Substance Use Topics   Alcohol use: Not Currently    Alcohol/week: 1.0 standard drink of alcohol    Types: 1 Cans of beer per week         Family History :     Family History  Problem Relation Age of Onset   Melanoma Father        Home Medications:   Prior to Admission medications   Medication Sig Start Date End Date Taking? Authorizing Provider  AMBULATORY NON FORMULARY MEDICATION Single glucometer with lancets, test strips.   Check fasting morning blood sugar QD. Please bill with insurance. 07/16/17   Carlis Stable, PA-C  aspirin EC 81 MG tablet Take 1 tablet (81 mg total) by mouth daily. 07/16/17   Carlis Stable, PA-C  atenolol (TENORMIN) 25 MG tablet Take 0.5 tablets (12.5 mg total) by mouth daily. 12/29/17   Carlis Stable, PA-C  atorvastatin (LIPITOR) 80 MG tablet Take 1 tablet (80 mg total) by mouth daily. Patient taking differently: Take 40 mg by mouth daily. 07/16/17   Carlis Stable, PA-C  atorvastatin (LIPITOR) 80 MG  tablet Take by mouth. 07/07/19   [provider]  buPROPion (WELLBUTRIN XL) 300 MG 24 hr tablet TAKE ONE TABLET BY MOUTH IN THE MORNING. Now is 300mg  05/07/22   05/09/22, MD  carvedilol (COREG) 25 MG tablet Take 1 tablet by mouth 2 (two) times daily. 07/07/19   [provider]  clopidogrel (PLAVIX) 75 MG tablet Take 1 tablet (75 mg total) by mouth daily. 07/16/17   07/18/17, PA-C  Dulaglutide (TRULICITY) 1.5 MG/0.5ML SOPN Inject 1.5 mg into the skin once a week. 06/06/18   06/08/18, PA-C  empagliflozin (JARDIANCE) 25 MG TABS tablet Take 0.5 tablets by mouth daily. 02/10/21   [provider]  escitalopram (LEXAPRO) 20 MG tablet Take 1 tablet (20 mg total) by mouth at bedtime. 05/07/22   05/09/22, MD  isosorbide mononitrate (IMDUR) 30 MG 24 hr tablet Take 1 tablet (30 mg total) by mouth daily. 07/16/17   07/18/17, PA-C  lamoTRIgine (LAMICTAL) 100 MG tablet Take 1 tablet (100 mg total) by mouth 2 (two) times daily. 05/07/22   05/09/22, MD  metFORMIN (GLUCOPHAGE-XR) 500 MG 24 hr tablet Take 1 tablet (500 mg total) by mouth 2 (two) times daily. 02/28/18   04/30/18, MD  Multiple Vitamin (MULTIVITAMIN) tablet Take by mouth.    [provider]  nitroGLYCERIN (NITROSTAT) 0.4 MG SL tablet Place 1 tablet (0.4 mg total) under the tongue every 5 (five) minutes as needed for chest pain (or tightness). 07/16/17   07/18/17, PA-C  pantoprazole (PROTONIX) 20 MG tablet TAKE 1 TABLET BY MOUTH ONCE DAILY 03/21/18   03/23/18, PA-C  ARIPiprazole (ABILIFY) 15 MG tablet Take 1 tablet (15 mg total) by mouth at bedtime. 09/13/17 12/23/21  02/22/22, PA-C  clonazePAM (KLONOPIN) 0.5 MG tablet TAKE 1 TABLET BY MOUTH EVERY  12 HOURS AS NEEDED FOR ANXIETY 04/15/18 11/11/21  Silverio Decamp, MD     Allergies:    No Known Allergies   Physical Exam:    Vitals  Blood pressure 136/73, pulse 60, temperature 97.8 F (36.6 C), temperature source Oral, resp. rate 16, height 5\' 8"  (1.727 m), weight 65.7 kg, SpO2 96 %.   1. General - middle-aged Caucasian gentleman who is thin in stature lying in hospital bed in no apparent discomfort  2. Normal affect and insight, Not Suicidal or Homicidal, Awake Alert,   3. No F.N deficits, ALL C.Nerves Intact, Strength 5/5 all 4 extremities, Sensation intact all 4 extremities, Plantars down going.  4. Ears and Eyes appear Normal, Conjunctivae clear, PERRLA. Moist Oral Mucosa.  5. Supple Neck, No JVD, No cervical lymphadenopathy appriciated, No Carotid Bruits.  6. Symmetrical Chest wall movement, Good air movement bilaterally, CTAB.  7. RRR, No Gallops, Rubs or Murmurs, No Parasternal Heave.  8. Positive Bowel Sounds, Abdomen Soft, No tenderness, No organomegaly appriciated,No rebound -guarding or rigidity.  9.  No Cyanosis, Normal Skin Turgor, No Skin Rash or Bruise.  10. Good muscle tone,  joints appear normal , no effusions, Normal ROM.  11. No Palpable Lymph Nodes in Neck or Axillae      Data Review:   Recent Labs  Lab 06/07/22 1749  WBC 5.9  HGB 13.9  HCT 41.0  PLT 242  MCV 91.9  MCH 31.2  MCHC 33.9  RDW 12.5    Recent Labs  Lab 06/07/22 1749  NA 133*  K 4.2  CL 100  CO2 25  ANIONGAP 8  GLUCOSE 161*  BUN 19  CREATININE 0.92  CALCIUM 8.4*      Recent Labs  Lab 06/07/22 1749  CALCIUM 8.4*    Recent Labs  Lab 06/07/22 1749  WBC 5.9  PLT 242  CREATININE 0.92    Urinalysis No results found for: "COLORURINE", "APPEARANCEUR", "LABSPEC", "PHURINE", "GLUCOSEU", "HGBUR", "BILIRUBINUR", "KETONESUR", "PROTEINUR", "UROBILINOGEN", "NITRITE", "LEUKOCYTESUR"    Imaging Results:    DG Chest 2 View  Result Date: 06/07/2022 CLINICAL DATA:  Chest pain radiating to left arm. Worsening dyspnea. EXAM: CHEST - 2 VIEW COMPARISON:  12/29/2017 FINDINGS: The heart size and  mediastinal contours are within normal limits. Prior CABG and coronary stents again noted. Both lungs are clear. The visualized skeletal structures are unremarkable. IMPRESSION: No active cardiopulmonary disease. Electronically Signed   By: Marlaine Hind M.D.   On: 06/07/2022 17:58    My personal review of EKG: Rhythm NSR, left bundle branch block    Assessment & Plan:    1.  Chest pain, Stable/exertional angina in a patient with history of CAD s/p CABG over 10 years ago and 9 cardiac stents.  He has been having exertional chest pain/angina on a progressive basis for the last 1 month, he is currently chest pain-free, EKG at rest and troponin x 3 negative.  At this time cardiology has been consulted for further workup, he will be kept on Coreg, Imdur, aspirin, Plavix and statin along with sublingual nitroglycerin for any breakthrough discomfort.  Monitor on telemetry.  2.  Hypertension.  Stable on Coreg and Imdur.  Monitor.  3.  Dyslipidemia.  Continue home dose statin.  4.  Anxiety and depression.  Home medications including Lexapro and Wellbutrin will be continued.    5.  GERD - PPI.  6. DM type II.  Check A1c, continue Jardiance, add sliding scale.  Takes once a week Trulicity.  DVT Prophylaxis Heparin   AM Labs Ordered, also please review Full Orders  Family Communication: Admission, patients condition and plan of care including tests being ordered have been discussed with the patient who indicates understanding and agree with the plan and Code Status.  Code Status Full  Likely DC to  TBD  Condition Fair  Consults called: Cards    Admission status: Obs    Time spent in minutes : 35  Signature  -    Lala Lund M.D on 06/08/2022 at 3:31 PM   -  To page go to [email or web address]

## 2022-06-08 NOTE — ED Notes (Signed)
Pt awake as RN checked on him.  He deneis any needs at this time other than a warm blanket which was given.

## 2022-06-08 NOTE — ED Notes (Signed)
Pt don't  like offered snacks at the ED, unable to give insulin without meal . Pt requesting to hold insulin until his food get here .

## 2022-06-08 NOTE — Consult Note (Addendum)
Cardiology Consultation   Patient ID: Mark Maynard. MRN: [id number]; DOB: 17-Oct-1955  Admit date: 06/07/2022 Date of Consult: 06/08/2022  PCP:  Lurline Hare, MD   [facility] Providers Cardiologist:  None        Patient Profile:   Mark Maynard. is a 67 y.o. male with a hx of CAD s/p CABG and multiple PCIs, diabetes mellitus type II, essential hypertension, hyperlipidemia who is being seen 06/08/2022 for the evaluation of chest pain at the request of [facility].  History of Present Illness:   Mark Maynard presented to the [facility] ED on 1/14 with symptoms of chest pain. He says that he began to notice exertionally associated chest pain about 10 days ago, though he now says in hindsight, symptoms may have begun earlier and he minimized. Pain radiates into his neck and left arm. Patient describes having to take a break while walking around the grocery store due to pain and dyspnea. He finally presented to the ED because the frequency of these symptoms began to increase and reminded him of previous MI pain. He denies orthopnea, palpitations, leg swelling, or weight gain. Patient does have a prescription for nitro but did not think to take it during episodes of pain. He reports compliance with his ASA and Plavix.    Patient says that his CABG was in 2002 in the [place] area. He has since undergone multiple left heart catheterizations with multiple stents placed, most recently in [place] in 2019. Patient says that he has not been regularly followed by cardiology but does have an established PCP at the [facility].    Past Medical History:  Diagnosis Date   Anxiety    CAD (coronary artery disease)    native 3-vessel disease   Choledocholithiasis    Depression    Diabetes mellitus without complication ([place])    Hyperlipidemia    Hypertension    Myocardial infarction ([place]) 2012   x 3, s/p CABG, 9 stents   Right middle lobe pneumonia 11/09/2017   Right  upper lobe pulmonary nodule 11/09/2017    Past Surgical History:  Procedure Laterality Date   CORONARY ARTERY BYPASS GRAFT  2012   CORONARY ARTERY BYPASS GRAFT     EYE SURGERY  1980's   ptyrgium removal   PERCUTANEOUS CORONARY STENT INTERVENTION (PCI-S)  06/2017     Home Medications:  Prior to Admission medications   Medication Sig Start Date End Date Taking? Authorizing Provider  AMBULATORY NON FORMULARY MEDICATION Single glucometer with lancets, test strips.   Check fasting morning blood sugar QD. Please bill with insurance. 07/16/17   Trixie Dredge, PA-C  aspirin EC 81 MG tablet Take 1 tablet (81 mg total) by mouth daily. 07/16/17   Trixie Dredge, PA-C  atenolol (TENORMIN) 25 MG tablet Take 0.5 tablets (12.5 mg total) by mouth daily. 12/29/17   Trixie Dredge, PA-C  atorvastatin (LIPITOR) 80 MG tablet Take 1 tablet (80 mg total) by mouth daily. Patient taking differently: Take 40 mg by mouth daily. 07/16/17   Trixie Dredge, PA-C  atorvastatin (LIPITOR) 80 MG tablet Take by mouth. 07/07/19   [provider]  buPROPion (WELLBUTRIN XL) 300 MG 24 hr tablet TAKE ONE TABLET BY MOUTH IN THE MORNING. Now is 300mg  05/07/22   Merian Capron, MD  carvedilol (COREG) 25 MG tablet Take 1 tablet by mouth 2 (two) times daily. 07/07/19   [provider]  clopidogrel (PLAVIX) 75  MG tablet Take 1 tablet (75 mg total) by mouth daily. 07/16/17   Carlis Stable, PA-C  Dulaglutide (TRULICITY) 1.5 MG/0.5ML SOPN Inject 1.5 mg into the skin once a week. 06/06/18   Carlis Stable, PA-C  empagliflozin (JARDIANCE) 25 MG TABS tablet Take 0.5 tablets by mouth daily. 02/10/21   [provider]  escitalopram (LEXAPRO) 20 MG tablet Take 1 tablet (20 mg total) by mouth at bedtime. 05/07/22   Thresa Ross, MD  isosorbide mononitrate (IMDUR) 30 MG 24 hr tablet Take 1 tablet (30 mg total) by mouth daily. 07/16/17   Carlis Stable, PA-C  lamoTRIgine (LAMICTAL) 100 MG tablet Take 1 tablet (100 mg total) by mouth 2 (two) times daily. 05/07/22   Thresa Ross, MD  metFORMIN (GLUCOPHAGE-XR) 500 MG 24 hr tablet Take 1 tablet (500 mg total) by mouth 2 (two) times daily. 02/28/18   Agapito Games, MD  Multiple Vitamin (MULTIVITAMIN) tablet Take by mouth.    [provider]  nitroGLYCERIN (NITROSTAT) 0.4 MG SL tablet Place 1 tablet (0.4 mg total) under the tongue every 5 (five) minutes as needed for chest pain (or tightness). 07/16/17   Carlis Stable, PA-C  pantoprazole (PROTONIX) 20 MG tablet TAKE 1 TABLET BY MOUTH ONCE DAILY 03/21/18   Carlis Stable, PA-C  ARIPiprazole (ABILIFY) 15 MG tablet Take 1 tablet (15 mg total) by mouth at bedtime. 09/13/17 12/23/21  Carlis Stable, PA-C  clonazePAM (KLONOPIN) 0.5 MG tablet TAKE 1 TABLET BY MOUTH EVERY 12 HOURS AS NEEDED FOR ANXIETY 04/15/18 11/11/21  Monica Becton, MD    Inpatient Medications: Scheduled Meds:  aspirin EC  81 mg Oral Daily   [START ON 06/09/2022] atorvastatin  80 mg Oral Daily   buPROPion  300 mg Oral Daily   carvedilol  25 mg Oral BID   clopidogrel  75 mg Oral Daily   empagliflozin  10 mg Oral Daily   escitalopram  20 mg Oral QHS   heparin injection (subcutaneous)  5,000 Units Subcutaneous Q8H   insulin aspart  0-5 Units Subcutaneous QHS   insulin aspart  0-9 Units Subcutaneous TID WC   isosorbide mononitrate  30 mg Oral Daily   lamoTRIgine  100 mg Oral BID   multivitamin with minerals  1 tablet Oral Daily   pantoprazole  20 mg Oral Daily   Continuous Infusions:  PRN Meds:   Allergies:   No Known Allergies  Social History:   Social History   Socioeconomic History   Marital status: Divorced    Spouse name: Not on file   Number of children: Not on file   Years of education: Not on file   Highest education level: Not on file  Occupational History   Not on file  Tobacco  Use   Smoking status: Never   Smokeless tobacco: Never  Vaping Use   Vaping Use: Never used  Substance and Sexual Activity   Alcohol use: Not Currently    Alcohol/week: 1.0 standard drink of alcohol    Types: 1 Cans of beer per week   Drug use: No   Sexual activity: Yes    Birth control/protection: None  Other Topics Concern   Not on file  Social History Narrative   Not on file   Social Determinants of Health   Financial Resource Strain: Not on file  Food Insecurity: No Food Insecurity (06/08/2022)   Hunger Vital Sign    Worried About Running Out of Food in the Last  Year: Never true    [place] in the Last Year: Never true  Transportation Needs: No Transportation Needs (06/08/2022)   PRAPARE - Hydrologist (Medical): No    Lack of Transportation (Non-Medical): No  Physical Activity: Not on file  Stress: Not on file  Social Connections: Not on file  Intimate Partner Violence: Not At Risk (06/08/2022)   Humiliation, Afraid, Rape, and Kick questionnaire    Fear of Current or Ex-Partner: No    Emotionally Abused: No    Physically Abused: No    Sexually Abused: No    Family History:    Family History  Problem Relation Age of Onset   Melanoma Father      ROS:  Please see the history of present illness.   All other ROS reviewed and negative.     Physical Exam/Data:   Vitals:   06/08/22 1000 06/08/22 1100 06/08/22 1448 06/08/22 1542  BP: (!) 103/50 126/61 136/73 (!) 140/73  Pulse: 68 65 60 (!) 58  Resp: 17 18 16 11   Temp: 98 F (36.7 C) 98.3 F (36.8 C) 97.8 F (36.6 C) 97.9 F (36.6 C)  TempSrc:  Oral Oral Axillary  SpO2: 98% 98% 96% 98%  Weight:   65.7 kg   Height:       No intake or output data in the 24 hours ending 06/08/22 1606    06/08/2022    2:48 PM 06/07/2022    5:40 PM 05/07/2022   12:59 PM  Last 3 Weights  Weight (lbs) 144 lb 13.5 oz 147 lb   Weight (kg) 65.7 kg 66.679 kg      Information is confidential  and restricted. Go to Review Flowsheets to unlock data.     Body mass index is 22.02 kg/m.  General:  Well nourished, well developed, in no acute distress HEENT: normal Neck: no JVD Vascular: No carotid bruits; Distal pulses 2+ bilaterally Cardiac:  normal S1, S2; RRR; no murmur  Lungs:  clear to auscultation bilaterally, no wheezing, rhonchi or rales  Abd: soft, nontender, no hepatomegaly  Ext: no edema Musculoskeletal:  No deformities, BUE and BLE strength normal and equal Skin: warm and dry  Neuro:  CNs 2-12 intact, no focal abnormalities noted Psych:  Normal affect   EKG:  The EKG was personally reviewed and demonstrates:  sinus rhythm with left bundle branch block (known since a least 2021). Telemetry:  Telemetry was personally reviewed and demonstrates:  sinus rhythm  Relevant CV Studies:  07/10/2017 TTE  Interpretation Summary A complete two-dimensional transthoracic echocardiogram was performed (2D, M-mode, Doppler and color flow Doppler). GLS performed and evaluated. The study was technically adequate. There is no comparison study available. Left ventricular systolic function is borderline reduced. Septal motion is consistent with post-operative state. There is mild mitral regurgitation. There is mild tricuspid regurgitation. Right ventricular systolic pressure is elevated at 40-17mmHg. Left Ventricle The left ventricle is normal in size. There is no thrombus. There is normal left ventricular wall thickness. Left ventricular systolic function is borderline reduced. Ejection Fraction = 50-55%. The transmitral spectral Doppler flow pattern is suggestive of impaired LV relaxation. Septal motion is consistent with post-operative state. Right Ventricle The right ventricle is normal in size and function. Atria The left atrium is mildly dilated. Right atrial size is normal. The IVC is normal in size. The interatrial septum is intact with no evidence for an atrial septal defect. Mitral Valve  The mitral valve leaflets  are mildly thickened, but open well. There is mild mitral regurgitation. Tricuspid Valve The tricuspid valve leaflets are thin and pliable. There is mild tricuspid regurgitation. Right ventricular systolic pressure is elevated at 40- . Aortic Valve The aortic valve is trileaflet. The aortic valve opens well. Mild aortic valve sclerosis without evidence of stenosis. No aortic regurgitation is present. Pulmonic Valve The pulmonic valve is not well seen, but is grossly normal. Trace pulmonic valvular regurgitation. Great Vessels The aortic root is normal size. Pericardium/Pleural There is no pericardial effusion.   06/08/2019 LHC ([facility])  FINDINGS:   Coronary Angiography 1. Left Main -patent, there is a stent that extends into the circumflex. It is widely patent.  2. Left anterior descending artery -occluded at the ostium. There is a previously noted stent in the LAD. 3. Left Circumflex -widely patent. There is 1 large obtuse marginal artery which is widely patent 4. Right Coronary Artery -there is a stent in the proximal portion. It is widely patent. The PDA and PLV branches are widely patent 5. There is a saphenous vein graft that supplies an intermediate ramus artery. It is widely patent. 6. The left internal mammary artery to the LAD is patent. After the graft there is a 70% stenosis in the LAD, and more distally, there is an 80 to 90% stenosis in the LAD. This is similar to before. Additional comments on angiography: Right dominant   Hemodynamics 1. Aortic Pressure -106/61 mmHg 2. Left Ventricular -103/5 mmHg   Left ventricular gram was performed. The EF is 55 to 60% with anterior hypokinesis   CONCLUSIONS:  1. Multivessel coronary artery disease 2. 2 of 2 grafts patent 3. There is significant disease in the LAD after the LIMA. It is too small for percutaneous intervention and is similar to the previous catheterization 4. Normal ejection fraction [facility]   Laboratory Data:  High Sensitivity Troponin:   Recent Labs  Lab 06/07/22 1749 06/07/22 1925  TROPONINIHS 5 6     Chemistry Recent Labs  Lab 06/07/22 1749  NA 133*  K 4.2  CL 100  CO2 25  GLUCOSE 161*  BUN 19  CREATININE 0.92  CALCIUM 8.4*  GFRNONAA >60  ANIONGAP 8    No results for input(s): "PROT", "ALBUMIN", "AST", "ALT", "ALKPHOS", "BILITOT" in the last 168 hours. Lipids No results for input(s): "CHOL", "TRIG", "HDL", "LABVLDL", "LDLCALC", "CHOLHDL" in the last 168 hours.  Hematology Recent Labs  Lab 06/07/22 1749  WBC 5.9  RBC 4.46  HGB 13.9  HCT 41.0  MCV 91.9  MCH 31.2  MCHC 33.9  RDW 12.5  PLT 242   Thyroid No results for input(s): "TSH", "FREET4" in the last 168 hours.  BNPNo results for input(s): "BNP", "PROBNP" in the last 168 hours.  DDimer No results for input(s): "DDIMER" in the last 168 hours.   Radiology/Studies:  DG Chest 2 View  Result Date: 06/07/2022 CLINICAL DATA:  Chest pain radiating to left arm. Worsening dyspnea. EXAM: CHEST - 2 VIEW COMPARISON:  12/29/2017 FINDINGS: The heart size and mediastinal contours are within normal limits. Prior CABG and coronary stents again noted. Both lungs are clear. The visualized skeletal structures are unremarkable. IMPRESSION: No active cardiopulmonary disease. Electronically Signed   By: Danae Orleans M.D.   On: 06/07/2022 17:58     Assessment and Plan:  Mark Nipp. is a 67 y.o. male with a hx of CAD s/p CABG and multiple PCIs, diabetes mellitus type II, essential hypertension, hyperlipidemia,  anxiety/depression who is being seen 06/08/2022 for the evaluation of exertional chest pain at the request of [facility].   Multivessel CAD s/p CABG Hyperlipidemia  Patient's most recent heart cath at [facility] in 2021 found 2 of 2 grafts patent, stable but significant disease in LAD distal to LIMA, too small for PCI. Recent symptoms are reportedly similar to those of previous NSTEMI/ACS  episodes. ECG is without acute ischemic changes and troponin negative/flat at 5, 6.  Although there is no evidence of acute myocardial injury, given significant cardiac history, will plan to evaluate with LHC. Possibly today as schedule allows as patient has been NPO. TTE ordered Continue ASA/Plavix Continue Atorvastatin 80mg  Continue Coreg 25mg  BID Continue Imdur 30mg  extended release Will check lipid panel and lipoprotein A  Shared Decision Making/Informed Consent The risks [stroke (1 in 1000), death (1 in 1000), kidney failure [usually temporary] (1 in 500), bleeding (1 in 200), allergic reaction [possibly serious] (1 in 200)], benefits (diagnostic support and management of coronary artery disease) and alternatives of a cardiac catheterization were discussed in detail with Mark Maynard and he is willing to proceed.   Hypertension  Patient's BP near normal. Continue Coreg 25mg  BID.  Diabetes Mellitus type II  Patient on Trulicity, Jardiance, Metformin. A1c is pending.  Per primary team:  Anxiety/depression  Risk Assessment/Risk Scores:                For questions or updates, please contact [facility] Please consult [email or web address] for contact info under    Signed, , PA-C  06/08/2022 4:06 PM  I have examined the patient and reviewed assessment and plan and discussed with patient.  Agree with above as stated.    Patient with long h/o CAD.  Sx he currently reports are similar to his prior angina.  Had a cath with no PCI target in 2021 at [facility].  He has not had a cath from the left wrist in the past.  His caths have all been from the groin. He is on excellent medical therapy. He has had multiple caths at other institutions.  THis would be his first at [facility].   Plan for cath today if possible.  He has not eaten any solid food today.  Had some clear liquids.  All questions about cath were answered.  He is in agreement.    06/10/2022

## 2022-06-08 NOTE — ED Provider Notes (Signed)
Patient has been here about 18 hours waiting for a bed assignment.  At this point we will initiate an ED to ED transfer.  Dr. Langston Masker accepting   Malvin Johns, MD 06/08/22 [phone]

## 2022-06-09 ENCOUNTER — Encounter (HOSPITAL_COMMUNITY): Payer: Self-pay | Admitting: Cardiovascular Disease

## 2022-06-09 ENCOUNTER — Other Ambulatory Visit (HOSPITAL_COMMUNITY): Payer: Self-pay

## 2022-06-09 ENCOUNTER — Observation Stay (HOSPITAL_BASED_OUTPATIENT_CLINIC_OR_DEPARTMENT_OTHER): Payer: Medicare Other

## 2022-06-09 DIAGNOSIS — I5022 Chronic systolic (congestive) heart failure: Secondary | ICD-10-CM

## 2022-06-09 DIAGNOSIS — R079 Chest pain, unspecified: Secondary | ICD-10-CM | POA: Diagnosis not present

## 2022-06-09 DIAGNOSIS — Z7902 Long term (current) use of antithrombotics/antiplatelets: Secondary | ICD-10-CM | POA: Diagnosis not present

## 2022-06-09 DIAGNOSIS — E118 Type 2 diabetes mellitus with unspecified complications: Secondary | ICD-10-CM

## 2022-06-09 DIAGNOSIS — I25110 Atherosclerotic heart disease of native coronary artery with unstable angina pectoris: Secondary | ICD-10-CM | POA: Diagnosis not present

## 2022-06-09 DIAGNOSIS — Z7984 Long term (current) use of oral hypoglycemic drugs: Secondary | ICD-10-CM | POA: Diagnosis not present

## 2022-06-09 DIAGNOSIS — I2089 Other forms of angina pectoris: Secondary | ICD-10-CM

## 2022-06-09 DIAGNOSIS — Z9861 Coronary angioplasty status: Secondary | ICD-10-CM | POA: Diagnosis not present

## 2022-06-09 DIAGNOSIS — Z951 Presence of aortocoronary bypass graft: Secondary | ICD-10-CM | POA: Diagnosis not present

## 2022-06-09 DIAGNOSIS — Z955 Presence of coronary angioplasty implant and graft: Secondary | ICD-10-CM | POA: Diagnosis not present

## 2022-06-09 DIAGNOSIS — E782 Mixed hyperlipidemia: Secondary | ICD-10-CM | POA: Diagnosis not present

## 2022-06-09 DIAGNOSIS — I10 Essential (primary) hypertension: Secondary | ICD-10-CM | POA: Diagnosis not present

## 2022-06-09 DIAGNOSIS — Z7982 Long term (current) use of aspirin: Secondary | ICD-10-CM | POA: Diagnosis not present

## 2022-06-09 DIAGNOSIS — Z79899 Other long term (current) drug therapy: Secondary | ICD-10-CM | POA: Diagnosis not present

## 2022-06-09 DIAGNOSIS — E119 Type 2 diabetes mellitus without complications: Secondary | ICD-10-CM | POA: Diagnosis not present

## 2022-06-09 DIAGNOSIS — R0789 Other chest pain: Secondary | ICD-10-CM | POA: Diagnosis not present

## 2022-06-09 LAB — GLUCOSE, CAPILLARY: Glucose-Capillary: 122 mg/dL — ABNORMAL HIGH (ref 70–99)

## 2022-06-09 LAB — LIPID PANEL
Cholesterol: 165 mg/dL (ref 0–200)
HDL: 38 mg/dL — ABNORMAL LOW (ref >40)
LDL Cholesterol: 92 mg/dL (ref 0–99)
Total CHOL/HDL Ratio: 4.3 RATIO
Triglycerides: 176 mg/dL — ABNORMAL HIGH (ref <150)
VLDL: 35 mg/dL (ref 0–40)

## 2022-06-09 LAB — CBC WITH DIFFERENTIAL/PLATELET
Abs Immature Granulocytes: 0.02 10*3/uL (ref 0.00–0.07)
Basophils Absolute: 0 10*3/uL (ref 0.0–0.1)
Basophils Relative: 1 %
Eosinophils Absolute: 0.3 10*3/uL (ref 0.0–0.5)
Eosinophils Relative: 5 %
HCT: 36.2 % — ABNORMAL LOW (ref 39.0–52.0)
Hemoglobin: 12.8 g/dL — ABNORMAL LOW (ref 13.0–17.0)
Immature Granulocytes: 0 %
Lymphocytes Relative: 16 %
Lymphs Abs: 1 10*3/uL (ref 0.7–4.0)
MCH: 31.9 pg (ref 26.0–34.0)
MCHC: 35.4 g/dL (ref 30.0–36.0)
MCV: 90.3 fL (ref 80.0–100.0)
Monocytes Absolute: 0.5 10*3/uL (ref 0.1–1.0)
Monocytes Relative: 9 %
Neutro Abs: 4 10*3/uL (ref 1.7–7.7)
Neutrophils Relative %: 69 %
Platelets: 180 10*3/uL (ref 150–400)
RBC: 4.01 MIL/uL — ABNORMAL LOW (ref 4.22–5.81)
RDW: 12.1 % (ref 11.5–15.5)
WBC: 5.8 10*3/uL (ref 4.0–10.5)
nRBC: 0 % (ref 0.0–0.2)

## 2022-06-09 LAB — BRAIN NATRIURETIC PEPTIDE: B Natriuretic Peptide: 221 pg/mL — ABNORMAL HIGH (ref 0.0–100.0)

## 2022-06-09 LAB — ECHOCARDIOGRAM COMPLETE
Area-P 1/2: 2.63 cm2
Calc EF: 44.3 %
Height: 68 in
MV M vel: 1.47 m/s
MV Peak grad: 8.6 mmHg
MV VTI: 2.31 cm2
S' Lateral: 3.6 cm
Single Plane A2C EF: 43.8 %
Single Plane A4C EF: 43.5 %
Weight: 2317.48 oz

## 2022-06-09 LAB — BASIC METABOLIC PANEL
Anion gap: 7 (ref 5–15)
BUN: 16 mg/dL (ref 8–23)
CO2: 25 mmol/L (ref 22–32)
Calcium: 8.5 mg/dL — ABNORMAL LOW (ref 8.9–10.3)
Chloride: 102 mmol/L (ref 98–111)
Creatinine, Ser: 1.02 mg/dL (ref 0.61–1.24)
GFR, Estimated: >60 mL/min (ref >60)
Glucose, Bld: 281 mg/dL — ABNORMAL HIGH (ref 70–99)
Potassium: 4 mmol/L (ref 3.5–5.1)
Sodium: 134 mmol/L — ABNORMAL LOW (ref 135–145)

## 2022-06-09 LAB — HEMOGLOBIN A1C
Hgb A1c MFr Bld: 7.5 % — ABNORMAL HIGH (ref 4.8–5.6)
Mean Plasma Glucose: 169 mg/dL

## 2022-06-09 LAB — MAGNESIUM: Magnesium: 2.1 mg/dL (ref 1.7–2.4)

## 2022-06-09 MED ORDER — SODIUM CHLORIDE 0.9% FLUSH: 3.0000 mL | INTRAVENOUS | Status: DC | PRN

## 2022-06-09 MED ORDER — LISINOPRIL 10 MG PO TABS: 10.0000 mg | ORAL_TABLET | Freq: Every day | ORAL | Status: DC

## 2022-06-09 MED ORDER — TICAGRELOR 90 MG PO TABS
90.0000 mg | ORAL_TABLET | Freq: Two times a day (BID) | ORAL | 2 refills | Status: AC
  Filled 2022-06-09: qty 60, 30d supply, fill #0

## 2022-06-09 MED ORDER — SODIUM CHLORIDE 0.9% FLUSH: 3.0000 mL | Freq: Two times a day (BID) | INTRAVENOUS | Status: DC

## 2022-06-09 MED ORDER — LISINOPRIL 10 MG PO TABS
10.0000 mg | ORAL_TABLET | Freq: Every day | ORAL | 0 refills | Status: AC
  Filled 2022-06-09: qty 30, 30d supply, fill #0

## 2022-06-09 MED ORDER — SODIUM CHLORIDE 0.9 % IV SOLN: 250.0000 mL | INTRAVENOUS | Status: DC | PRN

## 2022-06-09 MED FILL — Nitroglycerin IV Soln 100 MCG/ML in D5W: INTRA_ARTERIAL | Qty: 10 | Status: AC

## 2022-06-09 MED FILL — Verapamil HCl IV Soln 2.5 MG/ML: INTRAVENOUS | Qty: 2 | Status: AC

## 2022-06-09 NOTE — Progress Notes (Signed)
  Echocardiogram 2D Echocardiogram has been performed.  Mark Maynard 06/09/2022, 10:11 AM

## 2022-06-09 NOTE — Discharge Summary (Signed)
Mark Maynard. BJY:[id number] DOB: July 06, 1955 DOA: 06/07/2022  PCP: Tenna Delaine, MD  Admit date: 06/07/2022  Discharge date: 06/09/2022  Admitted From: Home   Disposition:  Home   Recommendations for Outpatient Follow-up:   Follow up with PCP in 1-2 weeks  PCP Please obtain BMP/CBC, 2 view CXR in 1week,  (see Discharge instructions)   PCP Please follow up on the following pending results: Monitor blood pressure, BMP, CBGs closely.  Must follow-up with cardiologist within 1 to 2 weeks of discharge.   Home Health: None   Equipment/Devices: None  Consultations: Cards Discharge Condition: Stable    CODE STATUS: Full    Diet Recommendation: Heart Healthy Low Carb  Diet Order             Diet regular Room service appropriate? Yes; Fluid consistency: Thin  Diet effective now                    Chief Complaint  Patient presents with   Chest Pain     Brief history of present illness from the day of admission and additional interim summary    67 y.o. male, with history of CAD CABG over 10 years ago followed by multiple stents up to 9.  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  Patient with above history presents to the ER with chief complaint of exertional substernal chest discomfort radiating to his left arm neck and jaw ongoing on a gradually progressive basis for the last 1 month.     Symptoms were gradually getting worse so he presented to med [facility] ER where his initial workup including blood work, EKG and troponin x 3 sets were negative.   Since he had significant history of coronary artery disease with symptoms suggestive of exertional angina cardiology was consulted who recommended hospital observation for further workup.  Patient currently at rest is symptom-free.                                                                 Hospital Course   1.  Chest pain, Unstable/exertional angina in a patient with history of CAD s/p CABG over 10 years ago and 9 cardiac stents.  He has been having exertional chest pain/angina on a progressive basis for the last 1 month, ruled out for MI and ER with nonacute EKG and negative troponin was seen by cardiology underwent left heart catheterization showing in-stent stenosis requiring  PTCA of circumflex obtuse marginal branch "in-stent restenosis, postprocedure symptom-free.  Seen by cardiology this morning Case discussed with  Dr. Irish Lack stable for discharge Plavix switched to Brilinta other than that continue aspirin, statin and beta-blocker.  Follow-up with PCP and cardiology within a week of discharge.   2.  Hypertension.  Stable on Coreg and Imdur, ACE inhibitor added by cardiology.  PCP to monitor.   3.  Dyslipidemia.  Continue home dose statin.   4.  Anxiety and depression.  Home medications including Lexapro and Wellbutrin will be continued.     5.  GERD - PPI.   6. DM type II.  A1c 7.4, continue home regimen for now PCP to monitor outpatient glycemic regimen and CBGs.   Discharge diagnosis     Principal Problem:   Chest pain Active Problems:   Unstable angina [facility])    Discharge instructions    Discharge Instructions     Amb Referral to Cardiac Rehabilitation   Complete by: As directed    Diagnosis: Other   After initial evaluation and assessments completed: Virtual Based Care may be provided alone or in conjunction with Phase 2 Cardiac Rehab based on patient barriers.: Yes   Intensive Cardiac Rehabilitation (ICR) [place] location only OR Traditional Cardiac Rehabilitation  (TCR) *If criteria for ICR are not met will enroll in TCR [facility] only): Yes   Discharge instructions   Complete by: As directed    Keep your groin area clean and dry at all times.  Do not use the bathtub for the next 2 weeks.  Not lift anything heavier than 10 pounds for 2 weeks.  Follow with Primary MD Lurline Hare, MD and the recommended cardiologist in 7 days   Get CBC, CMP, 2 view Chest X ray -  checked next visit with your primary MD    Activity: As tolerated with Full fall precautions use walker/cane & assistance as needed  Disposition Home    Diet: Heart Healthy Low Carb, check CBGs QAC - HS  Special Instructions: If you have smoked or chewed Tobacco  in the last 2 yrs please stop smoking, stop any regular Alcohol  and or any Recreational drug use.  On your next visit with your primary care physician please Get Medicines reviewed and adjusted.  Please request your Prim.MD to go over all Hospital Tests and Procedure/Radiological results at the follow up, please get all Hospital records sent to your Prim MD by signing hospital release before you go home.  If you experience worsening of your admission symptoms, develop shortness of breath, life threatening emergency, suicidal or homicidal thoughts you must seek medical attention immediately by calling 911 or calling your MD immediately  if symptoms less severe.  You Must read complete instructions/literature along with all the possible adverse reactions/side effects for all the Medicines you take and that have been prescribed to you. Take any new Medicines after you have completely understood and accpet all the possible adverse reactions/side effects.   Increase activity slowly   Complete by: As directed        Discharge Medications   Allergies as of 06/09/2022   No Known Allergies      Medication List     STOP taking these medications    atenolol 25 MG tablet Commonly known as: TENORMIN   clopidogrel 75 MG  tablet Commonly known as: PLAVIX       TAKE these medications    AMBULATORY NON FORMULARY MEDICATION Single glucometer with lancets, test strips.   Check fasting morning blood sugar QD. Please bill with insurance.   aspirin EC 81 MG tablet  Take 1 tablet (81 mg total) by mouth daily.   atorvastatin 80 MG tablet Commonly known as: LIPITOR Take by mouth. What changed: Another medication with the same name was removed. Continue taking this medication, and follow the directions you see here.   buPROPion 300 MG 24 hr tablet Commonly known as: WELLBUTRIN XL TAKE ONE TABLET BY MOUTH IN THE MORNING. Now is 300mg    carvedilol 25 MG tablet Commonly known as: COREG Take 1 tablet by mouth 2 (two) times daily.   Dulaglutide 1.5 MG/0.5ML Sopn Commonly known as: Trulicity Inject 1.5 mg into the skin once a week.   empagliflozin 25 MG Tabs tablet Commonly known as: JARDIANCE Take 0.5 tablets by mouth daily.   escitalopram 20 MG tablet Commonly known as: LEXAPRO Take 1 tablet (20 mg total) by mouth at bedtime.   isosorbide mononitrate 30 MG 24 hr tablet Commonly known as: IMDUR Take 1 tablet (30 mg total) by mouth daily.   lamoTRIgine 100 MG tablet Commonly known as: LAMICTAL Take 1 tablet (100 mg total) by mouth 2 (two) times daily.   lisinopril 10 MG tablet Commonly known as: ZESTRIL Take 1 tablet (10 mg total) by mouth daily.   metFORMIN 500 MG 24 hr tablet Commonly known as: GLUCOPHAGE-XR Take 1 tablet (500 mg total) by mouth 2 (two) times daily.   multivitamin tablet Take 1 tablet by mouth daily.   nitroGLYCERIN 0.4 MG SL tablet Commonly known as: Nitrostat Place 1 tablet (0.4 mg total) under the tongue every 5 (five) minutes as needed for chest pain (or tightness).   pantoprazole 20 MG tablet Commonly known as: PROTONIX TAKE 1 TABLET BY MOUTH ONCE DAILY   ticagrelor 90 MG Tabs tablet Commonly known as: BRILINTA Take 1 tablet (90 mg total) by mouth 2 (two)  times daily.         Follow-up Information     , MD. Schedule an appointment as soon as possible for a visit in 1 week(s).   Specialty: Internal Medicine Contact information: [address] [place] [address]         [phone], MD. Schedule an appointment as soon as possible for a visit in 1 week(s).   Specialties: Cardiology, Radiology, Interventional Cardiology Contact information: [address] [place] [id number]                 Major procedures and Radiology Reports - PLEASE review detailed and final reports thoroughly  -      CARDIAC CATHETERIZATION  Result Date: 06/08/2022 Images from the original result were not included.   Ost LAD to Prox LAD lesion is 100% stenosed.   1st Mrg-1 lesion is 99% stenosed.   Ramus lesion is 100% stenosed.   1st Mrg-2 lesion is 60% stenosed.   Acute Mrg lesion is 70% stenosed.   Mid LAD lesion is 70% stenosed.   Dist LAD lesion is 95% stenosed.   Previously placed Ost LM to Dist LM stent of unknown type is  widely patent.   Previously placed Ost Cx to Prox Cx stent of unknown type is  widely patent.   Previously placed Prox RCA to Mid RCA stent of unknown type is  widely patent.   The left ventricular systolic function is normal.   LV end diastolic pressure is moderately elevated.   The left ventricular ejection fraction is 50-55% by visual estimate. Joelle Flessner. is a 67 y.o. male  71  LOCATION:  FACILITY: [facility] PHYSICIAN: Quay Burow, M.D. 1955-09-02 DATE OF PROCEDURE:  06/08/2022 DATE OF DISCHARGE: CARDIAC CATHETERIZATION / PCI LCX-OM History obtained from chart review.Meredith Mells. is a 67 y.o. male with a hx of CAD s/p CABG and multiple PCIs, diabetes mellitus type II, essential hypertension, hyperlipidemia who is being seen 06/08/2022 for the evaluation of chest pain at the request of [facility].  Troponins were negative.  Patient's  story was consistent with his prior episodes of angina and CAD.  He was seen by Dr. Irish Lack who felt that diagnostic coronary angiography was warranted to define his anatomy. PROCEDURE DESCRIPTION: The patient was brought to the second floor [facility] cath lab in the postabsorptive state. He was premedicated with IV Versed and fentanyl. His right groin was prepped and shaved in usual sterile fashion. Xylocaine 1% was used for local anesthesia. A 5 French sheath was inserted into the right common femoral  artery using standard Seldinger technique.  5 French right left second sinus the catheters were used for selective coronary angiography, selective vein graft and IMA graft angiography and left ventriculography respectively.  Isovue dye is used for the entirety of the case (125 cc of contrast total to patient).  Retrograde ordered, ventricular abdominal pressures were recorded.  LVEDP was measured at 24. Patient received a total of 7000's of heparin with an ACT of 325.  He received Brilinta 180 mg p.o. loading dose.  Isovue dye is used for the entirety of the intervention.  Retrograde ordered pressures monitored during the case. Using a 6 [place] XB 3 guiding catheter along with a 0.14 [place] guiding wire and a 2 mm x 12 mm balloon I easily crossed the circumflex obtuse marginal branch "in-stent restenosis.  Then predilated with a 2 mm x 12 mm long compliant balloon at 8 atm upgrading to a 2.5 mm x 10 mm long noncompliant balloon at 14 atm (2.6 mm) resulting in reduction of 95% "in-stent restenosis to less than 20% residual.  Patient tolerated procedure well.  The guidewire and catheter were removed.  The sheath was sewn securely in place.  The patient left lab stable condition.   Successful PTCA of circumflex obtuse marginal branch "in-stent restenosis in the setting of unstable angina.  His anatomy by description was unchanged from his prior cath in 2019 otherwise.  He did have significant disease  beyond LIMA insertion which was not amenable to intervention.  The vein graft to the ramus branch was widely patent as was a LIMA to the LAD.  The RCA was widely patent as well.  There was a lesion in an acute marginal branch.  The sheath will be removed once ACT falls below 170 pressure held.  Patient be gently hydrated overnight and discharged home in the morning if he remains clinically stable.  Dr. Irish Lack, the patient's attending cardiologist, has been notified of these results. Quay Burow. MD, [facility] 06/08/2022 5:51 PM    DG Chest 2 View  Result Date: 06/07/2022 CLINICAL DATA:  Chest pain radiating to left arm. Worsening dyspnea. EXAM: CHEST - 2 VIEW COMPARISON:  12/29/2017 FINDINGS: The heart size and mediastinal contours are within normal limits. Prior CABG and coronary stents again noted. Both lungs are clear. The visualized skeletal structures are unremarkable. IMPRESSION: No active cardiopulmonary disease. Electronically Signed   By: Marlaine Hind M.D.   On: 06/07/2022 17:58    Micro Results    No results found for this or any previous visit (from the  past 240 hour(s)).  Today   Subjective    Rikki Smestad today has no headache,no chest abdominal pain,no new weakness tingling or numbness, feels much better wants to go home today.    Objective   Blood pressure 128/64, pulse 64, temperature 99 F (37.2 C), temperature source Oral, resp. rate 16, height 5\' 8"  (1.727 m), weight 65.7 kg, SpO2 96 %.   Intake/Output Summary (Last 24 hours) at 06/09/2022 1051 Last data filed at 06/09/2022 0731 Gross per 24 hour  Intake 1526.25 ml  Output 3150 ml  Net -1623.75 ml    Exam  Awake Alert, No new F.N deficits,    [place].AT,PERRAL Supple Neck,   Symmetrical Chest wall movement, Good air movement bilaterally, CTAB RRR,No Gallops,   +ve B.Sounds, Abd Soft, Non tender,  No Cyanosis, Clubbing or edema    Data Review   Recent Labs  Lab 06/07/22 1749 06/09/22 0017  WBC 5.9 5.8  HGB  13.9 12.8*  HCT 41.0 36.2*  PLT 242 180  MCV 91.9 90.3  MCH 31.2 31.9  MCHC 33.9 35.4  RDW 12.5 12.1  LYMPHSABS  --  1.0  MONOABS  --  0.5  EOSABS  --  0.3  BASOSABS  --  0.0    Recent Labs  Lab 06/07/22 1749 06/08/22 1531 06/09/22 0017  NA 133*  --  134*  K 4.2  --  4.0  CL 100  --  102  CO2 25  --  25  ANIONGAP 8  --  7  GLUCOSE 161*  --  281*  BUN 19  --  16  CREATININE 0.92  --  1.02  HGBA1C 7.5* 7.4*  --   BNP  --   --  221.0*  MG  --   --  2.1  CALCIUM 8.4*  --  8.5*     Total Time in preparing paper work, data evaluation and todays exam - 35 minutes  Signature  -    06/11/22 M.D on 06/09/2022 at 10:51 AM   -  To page go to [email or web address]

## 2022-06-09 NOTE — TOC Transition Note (Signed)
Transition of Care [facility]) - CM/SW Discharge Note   Patient Details  Name: Mark Maynard. MRN: [id number] Date of Birth: 08-Jul-1955  Transition of [facility]) CM/SW Contact:  Zenon Mayo, RN Phone Number: 06/09/2022, 11:29 AM   Clinical Narrative:    NCM spoke with patient, he states he can call a uber which is 16.00 for him.  TOC to fill meds.  Staff RN will bring him to [facility] to pick up meds. He has no other needs.         Patient Goals and CMS Choice      Discharge Placement                         Discharge Plan and Services Additional resources added to the After Visit Summary for                                       Social Determinants of Health (SDOH) Interventions SDOH Screenings   Food Insecurity: No Food Insecurity (06/08/2022)  Housing: Low Risk  (06/08/2022)  Transportation Needs: No Transportation Needs (06/08/2022)  Utilities: Not At Risk (06/08/2022)  Depression (PHQ2-9): High Risk (12/30/2021)  Tobacco Use: Low Risk  (06/09/2022)     Readmission Risk Interventions     No data to display

## 2022-06-09 NOTE — Discharge Instructions (Addendum)
Keep your groin area clean and dry at all times.  Do not use the bathtub for the next 2 weeks.  Not lift anything heavier than 10 pounds for 2 weeks.  Follow with Primary MD Lurline Hare, MD and the recommended cardiologist in 7 days   Get CBC, CMP, 2 view Chest X ray -  checked next visit with your primary MD    Activity: As tolerated with Full fall precautions use walker/cane & assistance as needed  Disposition Home    Diet: Heart Healthy Low Carb, check CBGs QAC - HS  Special Instructions: If you have smoked or chewed Tobacco  in the last 2 yrs please stop smoking, stop any regular Alcohol  and or any Recreational drug use.  On your next visit with your primary care physician please Get Medicines reviewed and adjusted.  Please request your Prim.MD to go over all Hospital Tests and Procedure/Radiological results at the follow up, please get all Hospital records sent to your Prim MD by signing hospital release before you go home.  If you experience worsening of your admission symptoms, develop shortness of breath, life threatening emergency, suicidal or homicidal thoughts you must seek medical attention immediately by calling 911 or calling your MD immediately  if symptoms less severe.  You Must read complete instructions/literature along with all the possible adverse reactions/side effects for all the Medicines you take and that have been prescribed to you. Take any new Medicines after you have completely understood and accpet all the possible adverse reactions/side effects.

## 2022-06-09 NOTE — TOC Benefit Eligibility Note (Signed)
Patient Teacher, English as a foreign language completed.    The patient is currently admitted and upon discharge could be taking Brilinta.  The current 30 day co-pay is $43.00.   The patient is insured through [employer]

## 2022-06-09 NOTE — TOC Benefit Eligibility Note (Addendum)
Patient Teacher, English as a foreign language completed.    The patient is currently admitted and upon discharge could be taking Brilinta 90 mg.  The current 30 day co-pay is $43.00.   The patient is insured through [place] ([place] only [facility] pharmacy that takes Tricare)   Lyndel Safe, [place] Patient [place] Patient Advocate Team Direct Number: [phone]  Fax: [phone]

## 2022-06-09 NOTE — TOC Progression Note (Signed)
Transition of Care (TOC) - Progression Note    Patient Details  Name: Mark Maynard. MRN: [id number] Date of Birth: April 03, 1956  Transition of Care [facility]) CM/SW Contact  Zenon Mayo, RN Phone Number: 06/09/2022, 11:11 AM  Clinical Narrative:    Patient needs a cab voucher to get to his car at [facility],  NCM will need to get permission from Supervisor , bluebird states this will be 43.00.  Awaiting to hear back from supervisor.         Expected Discharge Plan and Services         Expected Discharge Date: 06/09/22                                     Social Determinants of Health (SDOH) Interventions SDOH Screenings   Food Insecurity: No Food Insecurity (06/08/2022)  Housing: Low Risk  (06/08/2022)  Transportation Needs: No Transportation Needs (06/08/2022)  Utilities: Not At Risk (06/08/2022)  Depression (PHQ2-9): High Risk (12/30/2021)  Tobacco Use: Low Risk  (06/09/2022)    Readmission Risk Interventions     No data to display

## 2022-06-09 NOTE — Progress Notes (Signed)
CARDIAC REHAB PHASE I   PRE:  Rate/Rhythm: 70 NSR  BP:  Sitting: 133/63      SaO2: 94 RA  MODE:  Ambulation: 1100 ft   AD:  None  POST:  Rate/Rhythm: 87 NSR  BP:  Sitting: 137/77      SaO2: 94 RA  Pt amb with standby assistance, pt denies CP and SOB during amb and was returned to room w/o complaint.   Post-ambulation RPE 11 (fairly light) (scale 6-20) Pt walked well.  Pt was educated on PTCA, Plavix and ASA use,  wt restrictions, no baths/daily wash-ups, s/s of infection, ex guidelines (progressive walking and resistance exercise), s/s to stop exercising, NTG use and calling 911, heart healthy and diabetic  diet, risk factors (High LDL, High A1C), and CRPII. Pt received materials on exercise and diet. Pt requested to be referred to [facility].     Christen Bame  8:41 AM 06/09/2022    Service time is from 0800 to 0851.

## 2022-06-09 NOTE — Progress Notes (Addendum)
Rounding Note    Patient Name: Mark Maynard. Date of Encounter: 06/09/2022  [facility] Cardiologist: None   Subjective   Feels well.  No chest pain.  No bleeding issues.   Inpatient Medications    Scheduled Meds:  aspirin  81 mg Oral Daily   atorvastatin  80 mg Oral Daily   buPROPion  300 mg Oral Daily   carvedilol  25 mg Oral BID   empagliflozin  10 mg Oral Daily   escitalopram  20 mg Oral QHS   heparin injection (subcutaneous)  5,000 Units Subcutaneous Q8H   insulin aspart  0-5 Units Subcutaneous QHS   insulin aspart  0-9 Units Subcutaneous TID WC   isosorbide mononitrate  30 mg Oral Daily   lamoTRIgine  100 mg Oral BID   multivitamin with minerals  1 tablet Oral Daily   pantoprazole  20 mg Oral Daily   sodium chloride flush  3 mL Intravenous Q12H   ticagrelor  90 mg Oral BID   Continuous Infusions:  sodium chloride     PRN Meds: sodium chloride, acetaminophen, acetaminophen, morphine injection, nitroGLYCERIN, ondansetron (ZOFRAN) IV, ondansetron (ZOFRAN) IV, sodium chloride flush   Vital Signs    Vitals:   06/08/22 2300 06/09/22 0013 06/09/22 0427 06/09/22 0731  BP:  134/68 130/70 128/64  Pulse:  61 (!) 56 64  Resp:  18 10 16   Temp: 98.6 F (37 C)  97.8 F (36.6 C) 99 F (37.2 C)  TempSrc:   Oral Oral  SpO2:  97% 96% 96%  Weight:      Height:        Intake/Output Summary (Last 24 hours) at 06/09/2022 0936 Last data filed at 06/09/2022 0731 Gross per 24 hour  Intake 1526.25 ml  Output 3150 ml  Net -1623.75 ml      06/08/2022    2:48 PM 06/07/2022    5:40 PM 05/07/2022   12:59 PM  Last 3 Weights  Weight (lbs) 144 lb 13.5 oz 147 lb   Weight (kg) 65.7 kg 66.679 kg      Information is confidential and restricted. Go to Review Flowsheets to unlock data.      Telemetry    NSR - Personally Reviewed  ECG    NSR, LBBB - Personally Reviewed  Physical Exam   GEN: No acute distress.   Neck: No JVD Cardiac: RRR, no murmurs,  rubs, or gallops.  Respiratory: Clear to auscultation bilaterally. GI: Soft, nontender, non-distended ; right groin site intact, no hematoma, no swelling, 2+ right posterior tibial pulse MS: No edema; No deformity. Neuro:  Nonfocal  Psych: Normal affect   Labs    High Sensitivity Troponin:   Recent Labs  Lab 06/07/22 1749 06/07/22 1925 06/08/22 1531  TROPONINIHS 5 6 6      Chemistry Recent Labs  Lab 06/07/22 1749 06/09/22 0017  NA 133* 134*  K 4.2 4.0  CL 100 102  CO2 25 25  GLUCOSE 161* 281*  BUN 19 16  CREATININE 0.92 1.02  CALCIUM 8.4* 8.5*  MG  --  2.1  GFRNONAA >60 >60  ANIONGAP 8 7    Lipids  Recent Labs  Lab 06/09/22 0017  CHOL 165  TRIG 176*  HDL 38*  LDLCALC 92  CHOLHDL 4.3    Hematology Recent Labs  Lab 06/07/22 1749 06/09/22 0017  WBC 5.9 5.8  RBC 4.46 4.01*  HGB 13.9 12.8*  HCT 41.0 36.2*  MCV 91.9 90.3  MCH 31.2  31.9  MCHC 33.9 35.4  RDW 12.5 12.1  PLT 242 180   Thyroid No results for input(s): "TSH", "FREET4" in the last 168 hours.  BNP Recent Labs  Lab 06/09/22 0017  BNP 221.0*    DDimer No results for input(s): "DDIMER" in the last 168 hours.   Radiology    CARDIAC CATHETERIZATION  Result Date: 06/08/2022 Images from the original result were not included.   Ost LAD to Prox LAD lesion is 100% stenosed.   1st Mrg-1 lesion is 99% stenosed.   Ramus lesion is 100% stenosed.   1st Mrg-2 lesion is 60% stenosed.   Acute Mrg lesion is 70% stenosed.   Mid LAD lesion is 70% stenosed.   Dist LAD lesion is 95% stenosed.   Previously placed Ost LM to Dist LM stent of unknown type is  widely patent.   Previously placed Ost Cx to Prox Cx stent of unknown type is  widely patent.   Previously placed Prox RCA to Mid RCA stent of unknown type is  widely patent.   The left ventricular systolic function is normal.   LV end diastolic pressure is moderately elevated.   The left ventricular ejection fraction is 50-55% by visual estimate. Mark Maynard.  is a 67 y.o. male  [id number] LOCATION:  FACILITY: [place] PHYSICIAN: Quay Burow, M.D. [birth year] DATE OF PROCEDURE:  06/08/2022 DATE OF DISCHARGE: CARDIAC CATHETERIZATION / PCI LCX-OM History obtained from chart review.Mark Maynard. is a 67 y.o. male with a hx of CAD s/p CABG and multiple PCIs, diabetes mellitus type II, essential hypertension, hyperlipidemia who is being seen 06/08/2022 for the evaluation of chest pain at the request of [facility].  Troponins were negative.  Patient's story was consistent with his prior episodes of angina and CAD.  He was seen by Dr. Irish Lack who felt that diagnostic coronary angiography was warranted to define his anatomy. PROCEDURE DESCRIPTION: The patient was brought to the second floor [facility] cath lab in the postabsorptive state. He was premedicated with IV Versed and fentanyl. His right groin was prepped and shaved in usual sterile fashion. Xylocaine 1% was used for local anesthesia. A 5 French sheath was inserted into the right common femoral  artery using standard Seldinger technique.  5 French right left second sinus the catheters were used for selective coronary angiography, selective vein graft and IMA graft angiography and left ventriculography respectively.  Isovue dye is used for the entirety of the case (125 cc of contrast total to patient).  Retrograde ordered, ventricular abdominal pressures were recorded.  LVEDP was measured at 24. Patient received a total of 7000's of heparin with an ACT of 325.  He received Brilinta 180 mg p.o. loading dose.  Isovue dye is used for the entirety of the intervention.  Retrograde ordered pressures monitored during the case. Using a 6 [place] XB 3 guiding catheter along with a 0.14 [place] guiding wire and a 2 mm x 12 mm balloon I easily crossed the circumflex obtuse marginal branch "in-stent restenosis.  Then predilated with a 2 mm x 12 mm long compliant balloon at 8 atm upgrading to a 2.5 mm x 10 mm long  noncompliant balloon at 14 atm (2.6 mm) resulting in reduction of 95% "in-stent restenosis to less than 20% residual.  Patient tolerated procedure well.  The guidewire and catheter were removed.  The sheath was sewn securely in place.  The patient left lab stable condition.   Successful PTCA of circumflex  obtuse marginal branch "in-stent restenosis in the setting of unstable angina.  His anatomy by description was unchanged from his prior cath in 2019 otherwise.  He did have significant disease beyond LIMA insertion which was not amenable to intervention.  The vein graft to the ramus branch was widely patent as was a LIMA to the LAD.  The RCA was widely patent as well.  There was a lesion in an acute marginal branch.  The sheath will be removed once ACT falls below 170 pressure held.  Patient be gently hydrated overnight and discharged home in the morning if he remains clinically stable.  Dr. Eldridge Dace, the patient's attending cardiologist, has been notified of these results. Mark Maynard. MD, [facility] 06/08/2022 5:51 PM    DG Chest 2 View  Result Date: 06/07/2022 CLINICAL DATA:  Chest pain radiating to left arm. Worsening dyspnea. EXAM: CHEST - 2 VIEW COMPARISON:  12/29/2017 FINDINGS: The heart size and mediastinal contours are within normal limits. Prior CABG and coronary stents again noted. Both lungs are clear. The visualized skeletal structures are unremarkable. IMPRESSION: No active cardiopulmonary disease. Electronically Signed   By: Danae Orleans M.D.   On: 06/07/2022 17:58    Cardiac Studies   Cardiac cath films personally reviewed, PTCA of in-stent restenosis of the OM1.  Patient Profile     67 y.o. 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  He will need dual antiplatelet therapy for at least 6 months.  He had been on clopidogrel in the past.  Will plan for at least 1 month of Brilinta.  Depending on  cost, Brilinta could be switched to clopidogrel after 1 month.  Continue isosorbide given that he has small vessel disease in the distal LAD past the insertion of the LIMA.  Chronic systolic heart failure: By preliminary review, EF appears to be about 40 to 45%.  He is already on beta-blocker and SGLT2 inhibitor.  He appears euvolemic.  Based on the note from [place] on June 06, 2022, he was on lisinopril previously.  This would be a basic part of medical therapy for LV systolic dysfunction.  Type 2 diabetes: Has been on Trulicity, Jardiance and metformin.  Hyperlipidemia: Continue atorvastatin.   He is going to arrange cardiology follow-up through the [facility].  No lifting more than 10 pounds for about a week.  Right groin currently stable.  No residual swelling from what was documented at the time of the sheath pull yesterday.  For questions or updates, please contact [facility] Please consult [email or web address] for contact info under        Signed, Lance Muss, MD  06/09/2022, 9:36 AM

## 2022-06-10 LAB — LIPOPROTEIN A (LPA): Lipoprotein (a): 34 nmol/L — ABNORMAL HIGH (ref <75.0)

## 2022-06-12 ENCOUNTER — Telehealth (HOSPITAL_COMMUNITY): Payer: Self-pay

## 2022-06-12 NOTE — Telephone Encounter (Signed)
Phase II referral for Cardiac Rehab faxed to [place] 

## 2022-08-06 ENCOUNTER — Ambulatory Visit (HOSPITAL_COMMUNITY): Payer: Medicare Other | Admitting: Psychiatry

## 2022-10-17 DIAGNOSIS — I10 Essential (primary) hypertension: Secondary | ICD-10-CM | POA: Diagnosis not present

## 2022-10-17 DIAGNOSIS — Z794 Long term (current) use of insulin: Secondary | ICD-10-CM | POA: Diagnosis not present

## 2022-10-17 DIAGNOSIS — I2510 Atherosclerotic heart disease of native coronary artery without angina pectoris: Secondary | ICD-10-CM | POA: Diagnosis not present

## 2022-10-17 DIAGNOSIS — I252 Old myocardial infarction: Secondary | ICD-10-CM | POA: Diagnosis not present

## 2022-10-17 DIAGNOSIS — E119 Type 2 diabetes mellitus without complications: Secondary | ICD-10-CM | POA: Diagnosis not present

## 2022-10-17 DIAGNOSIS — Z7982 Long term (current) use of aspirin: Secondary | ICD-10-CM | POA: Diagnosis not present

## 2022-10-17 DIAGNOSIS — F329 Major depressive disorder, single episode, unspecified: Secondary | ICD-10-CM | POA: Diagnosis not present

## 2022-10-17 DIAGNOSIS — K760 Fatty (change of) liver, not elsewhere classified: Secondary | ICD-10-CM | POA: Diagnosis not present

## 2022-10-17 DIAGNOSIS — K8050 Calculus of bile duct without cholangitis or cholecystitis without obstruction: Secondary | ICD-10-CM | POA: Diagnosis not present

## 2022-10-17 DIAGNOSIS — Z7984 Long term (current) use of oral hypoglycemic drugs: Secondary | ICD-10-CM | POA: Diagnosis not present

## 2022-10-17 DIAGNOSIS — I700 Atherosclerosis of aorta: Secondary | ICD-10-CM | POA: Diagnosis not present

## 2022-10-17 DIAGNOSIS — Z79899 Other long term (current) drug therapy: Secondary | ICD-10-CM | POA: Diagnosis not present

## 2022-12-07 DIAGNOSIS — J342 Deviated nasal septum: Secondary | ICD-10-CM | POA: Diagnosis not present

## 2022-12-27 DIAGNOSIS — Z79899 Other long term (current) drug therapy: Secondary | ICD-10-CM | POA: Diagnosis not present

## 2022-12-27 DIAGNOSIS — Z9861 Coronary angioplasty status: Secondary | ICD-10-CM | POA: Diagnosis not present

## 2022-12-27 DIAGNOSIS — I10 Essential (primary) hypertension: Secondary | ICD-10-CM | POA: Diagnosis not present

## 2022-12-27 DIAGNOSIS — Z7902 Long term (current) use of antithrombotics/antiplatelets: Secondary | ICD-10-CM | POA: Diagnosis not present

## 2022-12-27 DIAGNOSIS — Z794 Long term (current) use of insulin: Secondary | ICD-10-CM | POA: Diagnosis not present

## 2022-12-27 DIAGNOSIS — Z951 Presence of aortocoronary bypass graft: Secondary | ICD-10-CM | POA: Diagnosis not present

## 2022-12-27 DIAGNOSIS — R1011 Right upper quadrant pain: Secondary | ICD-10-CM | POA: Diagnosis not present

## 2022-12-27 DIAGNOSIS — I2510 Atherosclerotic heart disease of native coronary artery without angina pectoris: Secondary | ICD-10-CM | POA: Diagnosis not present

## 2022-12-27 DIAGNOSIS — I252 Old myocardial infarction: Secondary | ICD-10-CM | POA: Diagnosis not present

## 2022-12-27 DIAGNOSIS — Z7982 Long term (current) use of aspirin: Secondary | ICD-10-CM | POA: Diagnosis not present

## 2022-12-27 DIAGNOSIS — R109 Unspecified abdominal pain: Secondary | ICD-10-CM | POA: Diagnosis not present

## 2022-12-27 DIAGNOSIS — I447 Left bundle-branch block, unspecified: Secondary | ICD-10-CM | POA: Diagnosis not present

## 2022-12-27 DIAGNOSIS — K8020 Calculus of gallbladder without cholecystitis without obstruction: Secondary | ICD-10-CM | POA: Diagnosis not present

## 2022-12-27 DIAGNOSIS — E119 Type 2 diabetes mellitus without complications: Secondary | ICD-10-CM | POA: Diagnosis not present

## 2023-01-19 DIAGNOSIS — I5020 Unspecified systolic (congestive) heart failure: Secondary | ICD-10-CM | POA: Diagnosis not present

## 2023-01-19 DIAGNOSIS — I358 Other nonrheumatic aortic valve disorders: Secondary | ICD-10-CM | POA: Diagnosis not present

## 2023-01-19 DIAGNOSIS — I2510 Atherosclerotic heart disease of native coronary artery without angina pectoris: Secondary | ICD-10-CM | POA: Diagnosis not present

## 2023-03-26 DIAGNOSIS — I5032 Chronic diastolic (congestive) heart failure: Secondary | ICD-10-CM | POA: Diagnosis not present

## 2023-03-26 DIAGNOSIS — Z794 Long term (current) use of insulin: Secondary | ICD-10-CM | POA: Diagnosis not present

## 2023-03-26 DIAGNOSIS — M19042 Primary osteoarthritis, left hand: Secondary | ICD-10-CM | POA: Diagnosis not present

## 2023-03-26 DIAGNOSIS — Z13220 Encounter for screening for lipoid disorders: Secondary | ICD-10-CM | POA: Diagnosis not present

## 2023-03-26 DIAGNOSIS — I2510 Atherosclerotic heart disease of native coronary artery without angina pectoris: Secondary | ICD-10-CM | POA: Diagnosis not present

## 2023-03-26 DIAGNOSIS — E7800 Pure hypercholesterolemia, unspecified: Secondary | ICD-10-CM | POA: Diagnosis not present

## 2023-03-26 DIAGNOSIS — E119 Type 2 diabetes mellitus without complications: Secondary | ICD-10-CM | POA: Diagnosis not present

## 2023-03-26 DIAGNOSIS — M19041 Primary osteoarthritis, right hand: Secondary | ICD-10-CM | POA: Diagnosis not present

## 2024-04-01 DIAGNOSIS — H7292 Unspecified perforation of tympanic membrane, left ear: Secondary | ICD-10-CM | POA: Diagnosis not present

## 2024-04-01 DIAGNOSIS — H6692 Otitis media, unspecified, left ear: Secondary | ICD-10-CM | POA: Diagnosis not present

## 2024-04-06 DIAGNOSIS — H6122 Impacted cerumen, left ear: Secondary | ICD-10-CM | POA: Diagnosis not present
